# Patient Record
Sex: Female | Born: 1940 | Race: Black or African American | Hispanic: No | Marital: Married | Smoking: Former smoker
Health system: Southern US, Community
[De-identification: ages and names within clinical notes are randomized; demographics above are authoritative.]

## PROBLEM LIST (undated history)

## (undated) DIAGNOSIS — J302 Other seasonal allergic rhinitis: Secondary | ICD-10-CM

## (undated) DIAGNOSIS — H409 Unspecified glaucoma: Secondary | ICD-10-CM

## (undated) DIAGNOSIS — E0500 Thyrotoxicosis with diffuse goiter without thyrotoxic crisis or storm: Secondary | ICD-10-CM

## (undated) DIAGNOSIS — I10 Essential (primary) hypertension: Secondary | ICD-10-CM

## (undated) DIAGNOSIS — M5430 Sciatica, unspecified side: Secondary | ICD-10-CM

## (undated) HISTORY — PX: DILATION AND CURETTAGE OF UTERUS: SHX78

## (undated) HISTORY — DX: Other seasonal allergic rhinitis: J30.2

## (undated) HISTORY — DX: Thyrotoxicosis with diffuse goiter without thyrotoxic crisis or storm: E05.00

## (undated) HISTORY — DX: Unspecified glaucoma: H40.9

## (undated) HISTORY — PX: CYST EXCISION: SHX5701

## (undated) HISTORY — DX: Sciatica, unspecified side: M54.30

## (undated) HISTORY — DX: Essential (primary) hypertension: I10

## (undated) HISTORY — PX: BREAST LUMPECTOMY: SHX2

---

## 1965-08-05 HISTORY — PX: BREAST EXCISIONAL BIOPSY: SUR124

## 2010-05-11 ENCOUNTER — Encounter: Admission: RE | Admit: 2010-05-11 | Discharge: 2010-05-11 | Payer: Self-pay | Admitting: Family Medicine

## 2010-08-14 ENCOUNTER — Other Ambulatory Visit
Admission: RE | Admit: 2010-08-14 | Discharge: 2010-08-14 | Payer: Self-pay | Source: Home / Self Care | Admitting: Obstetrics and Gynecology

## 2010-09-17 ENCOUNTER — Encounter (HOSPITAL_COMMUNITY)
Admission: RE | Admit: 2010-09-17 | Discharge: 2010-09-17 | Disposition: A | Discharge status: Home / Self Care | Payer: Medicare Other | Source: Ambulatory Visit | Attending: Obstetrics and Gynecology | Admitting: Obstetrics and Gynecology

## 2010-09-17 DIAGNOSIS — Z01812 Encounter for preprocedural laboratory examination: Secondary | ICD-10-CM | POA: Insufficient documentation

## 2010-09-17 LAB — URINALYSIS, ROUTINE W REFLEX MICROSCOPIC
Hgb urine dipstick: NEGATIVE
Specific Gravity, Urine: 1.01 (ref 1.005–1.030)
Urine Glucose, Fasting: NEGATIVE mg/dL
pH: 6.5 (ref 5.0–8.0)

## 2010-09-17 LAB — BASIC METABOLIC PANEL
CO2: 32 mEq/L (ref 19–32)
Chloride: 98 mEq/L (ref 96–112)
GFR calc Af Amer: >60 mL/min (ref >60)
Sodium: 137 mEq/L (ref 135–145)

## 2010-09-17 LAB — CBC
HCT: 41 % (ref 36.0–46.0)
Hemoglobin: 13.5 g/dL (ref 12.0–15.0)
RBC: 4.74 MIL/uL (ref 3.87–5.11)
WBC: 7.3 10*3/uL (ref 4.0–10.5)

## 2010-09-19 ENCOUNTER — Other Ambulatory Visit: Payer: Self-pay | Admitting: Obstetrics and Gynecology

## 2010-09-19 ENCOUNTER — Ambulatory Visit (HOSPITAL_COMMUNITY)
Admission: RE | Admit: 2010-09-19 | Discharge: 2010-09-19 | Disposition: A | Discharge status: Home / Self Care | Payer: Medicare Other | Source: Ambulatory Visit | Attending: Obstetrics and Gynecology | Admitting: Obstetrics and Gynecology

## 2010-09-19 DIAGNOSIS — Z01812 Encounter for preprocedural laboratory examination: Secondary | ICD-10-CM | POA: Insufficient documentation

## 2010-09-19 DIAGNOSIS — Z01818 Encounter for other preprocedural examination: Secondary | ICD-10-CM | POA: Insufficient documentation

## 2010-09-19 DIAGNOSIS — I10 Essential (primary) hypertension: Secondary | ICD-10-CM | POA: Insufficient documentation

## 2010-09-19 DIAGNOSIS — N840 Polyp of corpus uteri: Secondary | ICD-10-CM | POA: Insufficient documentation

## 2010-09-19 DIAGNOSIS — N950 Postmenopausal bleeding: Secondary | ICD-10-CM | POA: Insufficient documentation

## 2010-09-19 LAB — POTASSIUM: Potassium: 3 mEq/L — ABNORMAL LOW (ref 3.5–5.1)

## 2010-09-30 NOTE — Op Note (Signed)
  NAMEBLAKLEY, MICHNA NO.:  [id number]  MEDICAL RECORD NO.:  [id number]           PATIENT TYPE:  O  LOCATION:  [facility]                          [facility]  PHYSICIAN:  Gerald Leitz, MD          DATE OF BIRTH:  Jul 03, 1941  DATE OF PROCEDURE: DATE OF DISCHARGE:  09/19/2010                              OPERATIVE REPORT   PREOPERATIVE DIAGNOSES: 1. Postmenopausal bleeding. 2. Uterine fibroids. 3. Suspected endometrial polyps.  POSTOPERATIVE DIAGNOSES: 1. Postmenopausal bleeding. 2. Uterine fibroids. 3. Suspected endometrial polyps.  PROCEDURE:  Hysteroscopy, dilation and curettage, polypectomy with resectoscope.  SURGEON:  Gerald Leitz, MD  ASSISTANT:  None.  ANESTHESIA:  General.  FINDINGS:  Multiple uterine polyps, moderate cystocele.  SPECIMEN:  Endometrial curettings and resecting questionable polyps.  DISPOSITION OF SPECIMEN:  Pathology.  ESTIMATED BLOOD LOSS:  Minimal.  COMPLICATIONS:  None.  DESCRIPTION OF PROCEDURE:  The patient was taken to the operating room where she was placed under general anesthesia.  She was placed in the dorsal lithotomy position.  She was prepped and draped in the usual sterile fashion.  Speculum was placed in the vaginal vault.  The anterior lip of the cervix was grasped with a single-tooth tenaculum. 10 mL of 0.25% Marcaine were injected at the 4 and 8 o'clock positions. The cervix was then sounded to approximately 7 cm.  The cervix was dilated to approximately 6 mm and then diagnostic hysteroscope was inserted with the findings noted above.  The hysteroscope was removed, curette was inserted and a sharp curettage was performed until a gritty texture was noted.  The diagnostic hysteroscope was then reinserted. Polyps were still present and attempt to remove the polyps with polyp forceps was performed, this was unsuccessful.  The resectoscope was then inserted and the polyps were resected using the hysteroscopic  loop. There was no evidence of pressure perforation.  The resectoscope was removed.  The single-tooth tenaculum was removed from the anterior lip of the cervix.  The patient was noted to have some bleeding from the left side at the tenaculum site.  Silver nitrate was applied.  Excellent hemostasis was noted.  Speculum was removed.  The patient was awakened from anesthesia and taken to recovery room awake and in stable condition.  Glycine deficit was 335 mL.     Gerald Leitz, MD     TC/MEDQ  D:  09/19/2010  T:  09/20/2010  Job:  [id number]  Electronically Signed by Gerald Leitz MD on 09/30/2010 11:12:27 AM

## 2011-05-27 ENCOUNTER — Other Ambulatory Visit: Payer: Self-pay | Admitting: Family Medicine

## 2011-05-27 DIAGNOSIS — Z1231 Encounter for screening mammogram for malignant neoplasm of breast: Secondary | ICD-10-CM

## 2011-06-13 ENCOUNTER — Ambulatory Visit
Admission: RE | Admit: 2011-06-13 | Discharge: 2011-06-13 | Disposition: A | Discharge status: Home / Self Care | Payer: Medicare Other | Source: Ambulatory Visit | Attending: Family Medicine | Admitting: Family Medicine

## 2011-06-13 DIAGNOSIS — Z1231 Encounter for screening mammogram for malignant neoplasm of breast: Secondary | ICD-10-CM

## 2011-09-20 DIAGNOSIS — J329 Chronic sinusitis, unspecified: Secondary | ICD-10-CM | POA: Diagnosis not present

## 2011-11-04 DIAGNOSIS — N814 Uterovaginal prolapse, unspecified: Secondary | ICD-10-CM | POA: Diagnosis not present

## 2011-11-04 DIAGNOSIS — R238 Other skin changes: Secondary | ICD-10-CM | POA: Diagnosis not present

## 2011-11-07 DIAGNOSIS — H409 Unspecified glaucoma: Secondary | ICD-10-CM | POA: Diagnosis not present

## 2011-11-07 DIAGNOSIS — H4011X0 Primary open-angle glaucoma, stage unspecified: Secondary | ICD-10-CM | POA: Diagnosis not present

## 2011-11-07 DIAGNOSIS — Z961 Presence of intraocular lens: Secondary | ICD-10-CM | POA: Diagnosis not present

## 2012-01-10 DIAGNOSIS — H269 Unspecified cataract: Secondary | ICD-10-CM | POA: Diagnosis not present

## 2012-01-10 DIAGNOSIS — Z0000 Encounter for general adult medical examination without abnormal findings: Secondary | ICD-10-CM | POA: Diagnosis not present

## 2012-01-10 DIAGNOSIS — I10 Essential (primary) hypertension: Secondary | ICD-10-CM | POA: Diagnosis not present

## 2012-01-10 DIAGNOSIS — H409 Unspecified glaucoma: Secondary | ICD-10-CM | POA: Diagnosis not present

## 2012-01-10 DIAGNOSIS — E0590 Thyrotoxicosis, unspecified without thyrotoxic crisis or storm: Secondary | ICD-10-CM | POA: Diagnosis not present

## 2012-01-10 DIAGNOSIS — Z79899 Other long term (current) drug therapy: Secondary | ICD-10-CM | POA: Diagnosis not present

## 2012-01-10 DIAGNOSIS — E7800 Pure hypercholesterolemia, unspecified: Secondary | ICD-10-CM | POA: Diagnosis not present

## 2012-01-10 DIAGNOSIS — J309 Allergic rhinitis, unspecified: Secondary | ICD-10-CM | POA: Diagnosis not present

## 2012-04-09 DIAGNOSIS — H4011X0 Primary open-angle glaucoma, stage unspecified: Secondary | ICD-10-CM | POA: Diagnosis not present

## 2012-04-09 DIAGNOSIS — Z961 Presence of intraocular lens: Secondary | ICD-10-CM | POA: Diagnosis not present

## 2012-04-09 DIAGNOSIS — H409 Unspecified glaucoma: Secondary | ICD-10-CM | POA: Diagnosis not present

## 2012-07-07 DIAGNOSIS — Z23 Encounter for immunization: Secondary | ICD-10-CM | POA: Diagnosis not present

## 2012-07-15 ENCOUNTER — Other Ambulatory Visit: Payer: Self-pay | Admitting: Family Medicine

## 2012-07-15 DIAGNOSIS — Z1231 Encounter for screening mammogram for malignant neoplasm of breast: Secondary | ICD-10-CM

## 2012-08-24 ENCOUNTER — Ambulatory Visit
Admission: RE | Admit: 2012-08-24 | Discharge: 2012-08-24 | Disposition: A | Discharge status: Home / Self Care | Payer: Medicare Other | Source: Ambulatory Visit | Attending: Family Medicine | Admitting: Family Medicine

## 2012-08-24 DIAGNOSIS — Z1231 Encounter for screening mammogram for malignant neoplasm of breast: Secondary | ICD-10-CM

## 2012-09-08 ENCOUNTER — Other Ambulatory Visit (HOSPITAL_COMMUNITY)
Admission: RE | Admit: 2012-09-08 | Discharge: 2012-09-08 | Disposition: A | Discharge status: Home / Self Care | Payer: Medicare Other | Source: Ambulatory Visit | Attending: Obstetrics and Gynecology | Admitting: Obstetrics and Gynecology

## 2012-09-08 ENCOUNTER — Other Ambulatory Visit: Payer: Self-pay | Admitting: Obstetrics and Gynecology

## 2012-09-08 DIAGNOSIS — Z1151 Encounter for screening for human papillomavirus (HPV): Secondary | ICD-10-CM | POA: Insufficient documentation

## 2012-09-08 DIAGNOSIS — Z124 Encounter for screening for malignant neoplasm of cervix: Secondary | ICD-10-CM | POA: Insufficient documentation

## 2012-09-08 DIAGNOSIS — Z01419 Encounter for gynecological examination (general) (routine) without abnormal findings: Secondary | ICD-10-CM | POA: Diagnosis not present

## 2012-09-08 DIAGNOSIS — N814 Uterovaginal prolapse, unspecified: Secondary | ICD-10-CM | POA: Diagnosis not present

## 2012-11-05 DIAGNOSIS — I10 Essential (primary) hypertension: Secondary | ICD-10-CM | POA: Diagnosis not present

## 2012-11-05 DIAGNOSIS — J309 Allergic rhinitis, unspecified: Secondary | ICD-10-CM | POA: Diagnosis not present

## 2012-11-10 DIAGNOSIS — H409 Unspecified glaucoma: Secondary | ICD-10-CM | POA: Diagnosis not present

## 2012-11-10 DIAGNOSIS — H4011X0 Primary open-angle glaucoma, stage unspecified: Secondary | ICD-10-CM | POA: Diagnosis not present

## 2013-01-22 DIAGNOSIS — I10 Essential (primary) hypertension: Secondary | ICD-10-CM | POA: Diagnosis not present

## 2013-01-22 DIAGNOSIS — Z79899 Other long term (current) drug therapy: Secondary | ICD-10-CM | POA: Diagnosis not present

## 2013-01-22 DIAGNOSIS — J309 Allergic rhinitis, unspecified: Secondary | ICD-10-CM | POA: Diagnosis not present

## 2013-01-22 DIAGNOSIS — N951 Menopausal and female climacteric states: Secondary | ICD-10-CM | POA: Diagnosis not present

## 2013-01-22 DIAGNOSIS — Z6835 Body mass index (BMI) 35.0-35.9, adult: Secondary | ICD-10-CM | POA: Diagnosis not present

## 2013-01-22 DIAGNOSIS — E0590 Thyrotoxicosis, unspecified without thyrotoxic crisis or storm: Secondary | ICD-10-CM | POA: Diagnosis not present

## 2013-01-22 DIAGNOSIS — Z0000 Encounter for general adult medical examination without abnormal findings: Secondary | ICD-10-CM | POA: Diagnosis not present

## 2013-03-03 DIAGNOSIS — Z780 Asymptomatic menopausal state: Secondary | ICD-10-CM | POA: Diagnosis not present

## 2013-05-14 DIAGNOSIS — H4011X0 Primary open-angle glaucoma, stage unspecified: Secondary | ICD-10-CM | POA: Diagnosis not present

## 2013-05-14 DIAGNOSIS — Z961 Presence of intraocular lens: Secondary | ICD-10-CM | POA: Diagnosis not present

## 2013-05-14 DIAGNOSIS — H409 Unspecified glaucoma: Secondary | ICD-10-CM | POA: Diagnosis not present

## 2013-10-13 ENCOUNTER — Other Ambulatory Visit: Payer: Self-pay

## 2013-10-13 DIAGNOSIS — Z1231 Encounter for screening mammogram for malignant neoplasm of breast: Secondary | ICD-10-CM

## 2013-10-27 ENCOUNTER — Ambulatory Visit: Payer: Medicare Other

## 2013-11-16 DIAGNOSIS — N814 Uterovaginal prolapse, unspecified: Secondary | ICD-10-CM | POA: Diagnosis not present

## 2013-11-16 DIAGNOSIS — K5900 Constipation, unspecified: Secondary | ICD-10-CM | POA: Diagnosis not present

## 2013-11-16 DIAGNOSIS — N644 Mastodynia: Secondary | ICD-10-CM | POA: Diagnosis not present

## 2013-11-19 ENCOUNTER — Ambulatory Visit: Payer: Medicare Other

## 2013-11-19 ENCOUNTER — Ambulatory Visit
Admission: RE | Admit: 2013-11-19 | Discharge: 2013-11-19 | Disposition: A | Discharge status: Home / Self Care | Payer: Medicare Other | Source: Ambulatory Visit

## 2013-11-19 DIAGNOSIS — Z1231 Encounter for screening mammogram for malignant neoplasm of breast: Secondary | ICD-10-CM | POA: Diagnosis not present

## 2013-12-06 DIAGNOSIS — N814 Uterovaginal prolapse, unspecified: Secondary | ICD-10-CM | POA: Diagnosis not present

## 2013-12-09 DIAGNOSIS — Z1211 Encounter for screening for malignant neoplasm of colon: Secondary | ICD-10-CM | POA: Diagnosis not present

## 2013-12-09 DIAGNOSIS — Z86010 Personal history of colonic polyps: Secondary | ICD-10-CM | POA: Diagnosis not present

## 2013-12-09 DIAGNOSIS — K219 Gastro-esophageal reflux disease without esophagitis: Secondary | ICD-10-CM | POA: Diagnosis not present

## 2013-12-20 DIAGNOSIS — N814 Uterovaginal prolapse, unspecified: Secondary | ICD-10-CM | POA: Diagnosis not present

## 2013-12-29 DIAGNOSIS — H4011X0 Primary open-angle glaucoma, stage unspecified: Secondary | ICD-10-CM | POA: Diagnosis not present

## 2013-12-29 DIAGNOSIS — Z961 Presence of intraocular lens: Secondary | ICD-10-CM | POA: Diagnosis not present

## 2013-12-29 DIAGNOSIS — H01009 Unspecified blepharitis unspecified eye, unspecified eyelid: Secondary | ICD-10-CM | POA: Diagnosis not present

## 2013-12-29 DIAGNOSIS — H409 Unspecified glaucoma: Secondary | ICD-10-CM | POA: Diagnosis not present

## 2014-01-25 DIAGNOSIS — Z79899 Other long term (current) drug therapy: Secondary | ICD-10-CM | POA: Diagnosis not present

## 2014-01-25 DIAGNOSIS — I10 Essential (primary) hypertension: Secondary | ICD-10-CM | POA: Diagnosis not present

## 2014-01-25 DIAGNOSIS — H409 Unspecified glaucoma: Secondary | ICD-10-CM | POA: Diagnosis not present

## 2014-01-25 DIAGNOSIS — H269 Unspecified cataract: Secondary | ICD-10-CM | POA: Diagnosis not present

## 2014-01-25 DIAGNOSIS — Z800 Family history of malignant neoplasm of digestive organs: Secondary | ICD-10-CM | POA: Diagnosis not present

## 2014-01-25 DIAGNOSIS — Z6835 Body mass index (BMI) 35.0-35.9, adult: Secondary | ICD-10-CM | POA: Diagnosis not present

## 2014-01-25 DIAGNOSIS — Z0000 Encounter for general adult medical examination without abnormal findings: Secondary | ICD-10-CM | POA: Diagnosis not present

## 2014-01-25 DIAGNOSIS — E0590 Thyrotoxicosis, unspecified without thyrotoxic crisis or storm: Secondary | ICD-10-CM | POA: Diagnosis not present

## 2014-02-02 DIAGNOSIS — J069 Acute upper respiratory infection, unspecified: Secondary | ICD-10-CM | POA: Diagnosis not present

## 2014-02-23 DIAGNOSIS — D126 Benign neoplasm of colon, unspecified: Secondary | ICD-10-CM | POA: Diagnosis not present

## 2014-02-23 DIAGNOSIS — Z1211 Encounter for screening for malignant neoplasm of colon: Secondary | ICD-10-CM | POA: Diagnosis not present

## 2014-02-23 DIAGNOSIS — K5730 Diverticulosis of large intestine without perforation or abscess without bleeding: Secondary | ICD-10-CM | POA: Diagnosis not present

## 2014-02-23 DIAGNOSIS — Z86010 Personal history of colonic polyps: Secondary | ICD-10-CM | POA: Diagnosis not present

## 2014-06-01 DIAGNOSIS — H4011X2 Primary open-angle glaucoma, moderate stage: Secondary | ICD-10-CM | POA: Diagnosis not present

## 2014-06-14 DIAGNOSIS — Z23 Encounter for immunization: Secondary | ICD-10-CM | POA: Diagnosis not present

## 2014-12-02 DIAGNOSIS — H4011X2 Primary open-angle glaucoma, moderate stage: Secondary | ICD-10-CM | POA: Diagnosis not present

## 2014-12-14 ENCOUNTER — Other Ambulatory Visit: Payer: Self-pay

## 2014-12-14 DIAGNOSIS — Z1231 Encounter for screening mammogram for malignant neoplasm of breast: Secondary | ICD-10-CM

## 2014-12-21 DIAGNOSIS — Z01411 Encounter for gynecological examination (general) (routine) with abnormal findings: Secondary | ICD-10-CM | POA: Diagnosis not present

## 2014-12-21 DIAGNOSIS — B379 Candidiasis, unspecified: Secondary | ICD-10-CM | POA: Diagnosis not present

## 2014-12-21 DIAGNOSIS — N814 Uterovaginal prolapse, unspecified: Secondary | ICD-10-CM | POA: Diagnosis not present

## 2015-01-06 ENCOUNTER — Ambulatory Visit
Admission: RE | Admit: 2015-01-06 | Discharge: 2015-01-06 | Disposition: A | Discharge status: Home / Self Care | Payer: Medicare Other | Source: Ambulatory Visit

## 2015-01-06 DIAGNOSIS — Z1231 Encounter for screening mammogram for malignant neoplasm of breast: Secondary | ICD-10-CM

## 2015-02-14 DIAGNOSIS — I10 Essential (primary) hypertension: Secondary | ICD-10-CM | POA: Diagnosis not present

## 2015-02-14 DIAGNOSIS — Z0000 Encounter for general adult medical examination without abnormal findings: Secondary | ICD-10-CM | POA: Diagnosis not present

## 2015-02-14 DIAGNOSIS — E559 Vitamin D deficiency, unspecified: Secondary | ICD-10-CM | POA: Diagnosis not present

## 2015-02-14 DIAGNOSIS — Z79899 Other long term (current) drug therapy: Secondary | ICD-10-CM | POA: Diagnosis not present

## 2015-02-14 DIAGNOSIS — Z23 Encounter for immunization: Secondary | ICD-10-CM | POA: Diagnosis not present

## 2015-02-14 DIAGNOSIS — R739 Hyperglycemia, unspecified: Secondary | ICD-10-CM | POA: Diagnosis not present

## 2015-02-14 DIAGNOSIS — D692 Other nonthrombocytopenic purpura: Secondary | ICD-10-CM | POA: Diagnosis not present

## 2015-02-14 DIAGNOSIS — H6123 Impacted cerumen, bilateral: Secondary | ICD-10-CM | POA: Diagnosis not present

## 2015-02-14 DIAGNOSIS — E0590 Thyrotoxicosis, unspecified without thyrotoxic crisis or storm: Secondary | ICD-10-CM | POA: Diagnosis not present

## 2015-06-15 DIAGNOSIS — M5432 Sciatica, left side: Secondary | ICD-10-CM | POA: Diagnosis not present

## 2015-06-15 DIAGNOSIS — Z23 Encounter for immunization: Secondary | ICD-10-CM | POA: Diagnosis not present

## 2015-06-15 DIAGNOSIS — R251 Tremor, unspecified: Secondary | ICD-10-CM | POA: Diagnosis not present

## 2015-06-15 DIAGNOSIS — M722 Plantar fascial fibromatosis: Secondary | ICD-10-CM | POA: Diagnosis not present

## 2015-07-04 ENCOUNTER — Other Ambulatory Visit: Payer: Medicare Other

## 2015-07-04 ENCOUNTER — Other Ambulatory Visit: Payer: Self-pay | Admitting: Family Medicine

## 2015-07-04 DIAGNOSIS — M79604 Pain in right leg: Secondary | ICD-10-CM

## 2015-07-05 ENCOUNTER — Ambulatory Visit
Admission: RE | Admit: 2015-07-05 | Discharge: 2015-07-05 | Disposition: A | Discharge status: Home / Self Care | Payer: Medicare Other | Source: Ambulatory Visit | Attending: Family Medicine | Admitting: Family Medicine

## 2015-07-05 DIAGNOSIS — M79604 Pain in right leg: Secondary | ICD-10-CM

## 2015-07-05 DIAGNOSIS — M79661 Pain in right lower leg: Secondary | ICD-10-CM | POA: Diagnosis not present

## 2015-07-10 ENCOUNTER — Encounter: Payer: Self-pay | Admitting: Neurology

## 2015-07-10 ENCOUNTER — Ambulatory Visit (INDEPENDENT_AMBULATORY_CARE_PROVIDER_SITE_OTHER): Payer: Medicare Other | Admitting: Neurology

## 2015-07-10 VITALS — BP 120/78 | HR 65 | Ht 65.0 in | Wt 215.0 lb

## 2015-07-10 DIAGNOSIS — M79604 Pain in right leg: Secondary | ICD-10-CM | POA: Diagnosis not present

## 2015-07-10 DIAGNOSIS — R251 Tremor, unspecified: Secondary | ICD-10-CM | POA: Diagnosis not present

## 2015-07-10 NOTE — Progress Notes (Signed)
Caitlin Woodward was seen today in the movement disorders clinic for neurologic consultation at the request of Lilian Coma, MD.  The consultation is for the evaluation of tremor and to r/o PD.  Pt reports that she has had tremor for many years (1986) and initially thought it was due to Graves disease but after treatment it did not go away.  So, she was referred to [facility] for it in 2008 and was told not to worry about it.  She moved here in 2010.  She states that she went to her PCP for her sciatic pain and she noted that the tremor was increased.  The patient thought it was b/c her sciatic pain was increasing.  She was given etodolac and it helped but once she d/c it it came back.  She has started prednisone and will finish in 2 days.  That has not really changed tremor.  Stress is the only thing that changes tremor.  She drinks 2-3 cups of coffee per day and she thinks that may pick up tremor.  Alcohol does not change tremor.  No trouble eating soup.  Both hands shake equally.  Hands only shake with use.  Fam hx of tremor in father and fathers sister.  Specific Symptoms:  Tremor: Yes.   Voice: no change Sleep: sleeps well  Vivid Dreams:  No.  Acting out dreams:  No. Wet Pillows: No. Postural symptoms:  No.  Falls?  No. Bradykinesia symptoms: no bradykinesia noted Loss of smell:  No. Loss of taste:  No. Urinary Incontinence:  No. Difficulty Swallowing:  No. Handwriting, micrographia: No. Trouble with ADL's:  No.  Trouble buttoning clothing: No. Depression:  No. Memory changes:  No. Hallucinations:  No.  visual distortions: No. N/V:  No. Lightheaded:  No.  Syncope: No. Diplopia:  No. Dyskinesia:  No.  Neuroimaging has not previously been performed.   As above, she is complaining about some "sciatic pain" in the right leg.  It was involving the entire right leg and buttocks and she had trouble isolating it further but after taking the etodolac and now the prednisone seems  to only involve the right knee and right ankle and is much more severe when she drives.  She also notices rather significant groin pain now.  PREVIOUS MEDICATIONS: none to date  ALLERGIES:   Allergies  Allergen Reactions  . Codeine Nausea And Vomiting    CURRENT MEDICATIONS:  Outpatient Encounter Prescriptions as of 07/10/2015  Medication Sig  . aspirin 81 MG tablet Take 81 mg by mouth daily.  . cetirizine (ZYRTEC) 10 MG tablet Take 10 mg by mouth daily as needed for allergies.  . chlorthalidone (HYGROTON) 25 MG tablet Take 25 mg by mouth daily.   . dorzolamide (TRUSOPT) 2 % ophthalmic solution Place 1 drop into both eyes 2 (two) times daily.  Marland Kitchen latanoprost (XALATAN) 0.005 % ophthalmic solution Place 1 drop into both eyes at bedtime.   . Multiple Vitamin (MULTIVITAMIN) tablet Take 1 tablet by mouth daily.  . predniSONE (STERAPRED UNI-PAK 21 TAB) 10 MG (21) TBPK tablet USE AS DIRECTED. TAPER AS FOLLOWED: 6-5-4-3-2-1   No facility-administered encounter medications on file as of 07/10/2015.    PAST MEDICAL HISTORY:   Past Medical History  Diagnosis Date  . Glaucoma   . Hypertension   . Seasonal allergies   . Sciatica   . Graves disease     PAST SURGICAL HISTORY:   Past Surgical History  Procedure Laterality Date  .  Dilation and curettage of uterus      x 3  . Breast lumpectomy      non cancerous  . Cyst excision      foot    SOCIAL HISTORY:   Social History   Social History  . Marital Status: Married    Spouse Name: N/A  . Number of Children: N/A  . Years of Education: N/A   Occupational History  . Not on file.   Social History Main Topics  . Smoking status: Former Smoker -- 2 years    Quit date: 07/09/1960  . Smokeless tobacco: Not on file  . Alcohol Use: 0.0 oz/week    0 Standard drinks or equivalent per week     Comment: wine - 2-3 drinks a week  . Drug Use: No  . Sexual Activity: Not on file   Other Topics Concern  . Not on file   Social History  Narrative  . No narrative on file    FAMILY HISTORY:   Family Status  Relation Status Death Age  . Mother Alive     97 - Hypertension, glaucoma  . Father Deceased     alzheimers  . Brother Deceased     colon cancer  . Son Alive     healthy  . Son Alive     healthy  . Daughter Alive     eczema    ROS:  A complete 10 system review of systems was obtained and was unremarkable apart from what is mentioned above.  PHYSICAL EXAMINATION:    VITALS:   Filed Vitals:   07/10/15 1229  BP: 120/78  Pulse: 65  Height: 5\' 5"  (1.651 m)  Weight: 215 lb (97.523 kg)    GEN:  The patient appears stated age and is in NAD. HEENT:  Normocephalic, atraumatic.  The mucous membranes are moist. The superficial temporal arteries are without ropiness or tenderness. CV:  RRR Lungs:  CTAB Neck/HEME:  There are no carotid bruits bilaterally.  Neurological examination:  Orientation: The patient is alert and oriented x3. Fund of knowledge is appropriate.  Recent and remote memory are intact.  Attention and concentration are normal.    Able to name objects and repeat phrases. Cranial nerves: There is good facial symmetry. Pupils are equal round and reactive to light bilaterally. Fundoscopic exam reveals clear margins bilaterally. Extraocular muscles are intact. The visual fields are full to confrontational testing. The speech is fluent and clear. Soft palate rises symmetrically and there is no tongue deviation. Hearing is intact to conversational tone. Sensation: Sensation is intact to light and pinprick throughout (facial, trunk, extremities). Vibration is intact at the bilateral big toe, although it is decreased. There is no extinction with double simultaneous stimulation. There is no sensory dermatomal level identified. Motor: Strength is 5/5 in the bilateral upper and lower extremities.   Shoulder shrug is equal and symmetric.  There is no pronator drift. Deep tendon reflexes: Deep tendon reflexes are  0-1/4 at the bilateral biceps, triceps, brachioradialis, patella and achilles. Plantar responses are downgoing bilaterally.    Movement examination: Tone: There is normal tone in the bilateral upper extremities.  The tone in the lower extremities is normal.  Abnormal movements: There is no tremor at rest.  There was very rare tremor of the outstretched hands.  There was slight difficulty with Archimedes spirals.  She spilled some water when asked to pour a full glass of water from one glass to another, but it is more because  of carelessness than it is because of tremor. Coordination:  There is no decremation with RAM's, with any form of RAMS, including alternating supination and pronation of the forearm, hand opening and closing, finger taps, heel taps and toe taps. Gait and Station: The patient has no difficulty arising out of a deep-seated chair without the use of the hands. The patient's stride length is normal with normal arm swing.    ASSESSMENT/PLAN:  1.  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  She certainly did not have any evidence of parkinsonism today and does not meet criteria for idiopathic Parkinson's disease.  Reassurance was provided.  I did tell her that should any further neurologic symptoms develop she should not hesitate to call me and I would be happy to see her back.  Patient education was provided. 2.  Right leg pain  -This is actually markedly improved after a course of etodolac and now prednisone.  She only has 2 days of prednisone left, but states that it is still very difficult for her to push on the gas and brake and drive.  I encouraged her to follow-up with her primary care physician if it does not go away after the course of prednisone as I am not completely convinced that this is all radicular in nature.  She is no longer having any back pain at all and states that it is primarily isolated to the knee and  below, but is also having some groin pain, which makes me think that this may be a hip issue.  Regardless, she may need some imaging.  She assured me that she would follow-up with her primary care physician in regards to the symptoms. 3.  I will follow up with her on an as-needed basis.  Much greater than 50% of this visit was spent in counseling with the patient.  Total face to face time:  45 min

## 2015-08-09 DIAGNOSIS — H401112 Primary open-angle glaucoma, right eye, moderate stage: Secondary | ICD-10-CM | POA: Diagnosis not present

## 2015-08-09 DIAGNOSIS — Z0100 Encounter for examination of eyes and vision without abnormal findings: Secondary | ICD-10-CM | POA: Diagnosis not present

## 2015-08-09 DIAGNOSIS — H401122 Primary open-angle glaucoma, left eye, moderate stage: Secondary | ICD-10-CM | POA: Diagnosis not present

## 2015-08-09 DIAGNOSIS — Z961 Presence of intraocular lens: Secondary | ICD-10-CM | POA: Diagnosis not present

## 2015-09-01 DIAGNOSIS — M5431 Sciatica, right side: Secondary | ICD-10-CM | POA: Diagnosis not present

## 2015-09-01 DIAGNOSIS — M25551 Pain in right hip: Secondary | ICD-10-CM | POA: Diagnosis not present

## 2015-09-01 DIAGNOSIS — Z6835 Body mass index (BMI) 35.0-35.9, adult: Secondary | ICD-10-CM | POA: Diagnosis not present

## 2015-09-12 DIAGNOSIS — M5416 Radiculopathy, lumbar region: Secondary | ICD-10-CM | POA: Diagnosis not present

## 2015-09-12 DIAGNOSIS — M25552 Pain in left hip: Secondary | ICD-10-CM | POA: Diagnosis not present

## 2015-09-15 DIAGNOSIS — M25551 Pain in right hip: Secondary | ICD-10-CM | POA: Diagnosis not present

## 2015-09-15 DIAGNOSIS — M5416 Radiculopathy, lumbar region: Secondary | ICD-10-CM | POA: Diagnosis not present

## 2015-09-19 DIAGNOSIS — M5416 Radiculopathy, lumbar region: Secondary | ICD-10-CM | POA: Diagnosis not present

## 2015-09-19 DIAGNOSIS — M25551 Pain in right hip: Secondary | ICD-10-CM | POA: Diagnosis not present

## 2015-09-22 DIAGNOSIS — M5416 Radiculopathy, lumbar region: Secondary | ICD-10-CM | POA: Diagnosis not present

## 2015-09-22 DIAGNOSIS — M25551 Pain in right hip: Secondary | ICD-10-CM | POA: Diagnosis not present

## 2015-09-26 DIAGNOSIS — M25551 Pain in right hip: Secondary | ICD-10-CM | POA: Diagnosis not present

## 2015-09-26 DIAGNOSIS — M5416 Radiculopathy, lumbar region: Secondary | ICD-10-CM | POA: Diagnosis not present

## 2015-09-29 DIAGNOSIS — M25551 Pain in right hip: Secondary | ICD-10-CM | POA: Diagnosis not present

## 2015-09-29 DIAGNOSIS — M5416 Radiculopathy, lumbar region: Secondary | ICD-10-CM | POA: Diagnosis not present

## 2015-10-03 DIAGNOSIS — M25551 Pain in right hip: Secondary | ICD-10-CM | POA: Diagnosis not present

## 2015-10-03 DIAGNOSIS — M5416 Radiculopathy, lumbar region: Secondary | ICD-10-CM | POA: Diagnosis not present

## 2015-10-06 DIAGNOSIS — M25551 Pain in right hip: Secondary | ICD-10-CM | POA: Diagnosis not present

## 2015-10-06 DIAGNOSIS — M5416 Radiculopathy, lumbar region: Secondary | ICD-10-CM | POA: Diagnosis not present

## 2015-10-13 DIAGNOSIS — M5416 Radiculopathy, lumbar region: Secondary | ICD-10-CM | POA: Diagnosis not present

## 2015-10-13 DIAGNOSIS — M25551 Pain in right hip: Secondary | ICD-10-CM | POA: Diagnosis not present

## 2015-10-17 DIAGNOSIS — M5416 Radiculopathy, lumbar region: Secondary | ICD-10-CM | POA: Diagnosis not present

## 2015-10-17 DIAGNOSIS — M25551 Pain in right hip: Secondary | ICD-10-CM | POA: Diagnosis not present

## 2015-10-20 DIAGNOSIS — M5416 Radiculopathy, lumbar region: Secondary | ICD-10-CM | POA: Diagnosis not present

## 2015-10-20 DIAGNOSIS — M25551 Pain in right hip: Secondary | ICD-10-CM | POA: Diagnosis not present

## 2015-10-25 DIAGNOSIS — R300 Dysuria: Secondary | ICD-10-CM | POA: Diagnosis not present

## 2015-11-07 ENCOUNTER — Other Ambulatory Visit: Payer: Self-pay | Admitting: Physical Medicine and Rehabilitation

## 2015-11-07 DIAGNOSIS — M545 Low back pain: Secondary | ICD-10-CM

## 2015-11-07 DIAGNOSIS — M5416 Radiculopathy, lumbar region: Secondary | ICD-10-CM | POA: Diagnosis not present

## 2015-11-07 DIAGNOSIS — M47817 Spondylosis without myelopathy or radiculopathy, lumbosacral region: Secondary | ICD-10-CM | POA: Diagnosis not present

## 2015-11-10 DIAGNOSIS — M7742 Metatarsalgia, left foot: Secondary | ICD-10-CM | POA: Diagnosis not present

## 2015-11-10 DIAGNOSIS — M2041 Other hammer toe(s) (acquired), right foot: Secondary | ICD-10-CM | POA: Diagnosis not present

## 2015-11-10 DIAGNOSIS — M79674 Pain in right toe(s): Secondary | ICD-10-CM | POA: Diagnosis not present

## 2015-11-10 DIAGNOSIS — M79675 Pain in left toe(s): Secondary | ICD-10-CM | POA: Diagnosis not present

## 2015-11-10 DIAGNOSIS — M7741 Metatarsalgia, right foot: Secondary | ICD-10-CM | POA: Diagnosis not present

## 2015-11-10 DIAGNOSIS — L602 Onychogryphosis: Secondary | ICD-10-CM | POA: Diagnosis not present

## 2015-11-10 DIAGNOSIS — M2042 Other hammer toe(s) (acquired), left foot: Secondary | ICD-10-CM | POA: Diagnosis not present

## 2015-11-14 ENCOUNTER — Ambulatory Visit
Admission: RE | Admit: 2015-11-14 | Discharge: 2015-11-14 | Disposition: A | Discharge status: Home / Self Care | Payer: Medicare Other | Source: Ambulatory Visit | Attending: Physical Medicine and Rehabilitation | Admitting: Physical Medicine and Rehabilitation

## 2015-11-14 DIAGNOSIS — M545 Low back pain: Secondary | ICD-10-CM

## 2015-11-14 DIAGNOSIS — M5126 Other intervertebral disc displacement, lumbar region: Secondary | ICD-10-CM | POA: Diagnosis not present

## 2015-11-21 DIAGNOSIS — M25551 Pain in right hip: Secondary | ICD-10-CM | POA: Diagnosis not present

## 2015-11-28 ENCOUNTER — Other Ambulatory Visit: Payer: Self-pay | Admitting: Family Medicine

## 2015-11-28 ENCOUNTER — Ambulatory Visit
Admission: RE | Admit: 2015-11-28 | Discharge: 2015-11-28 | Disposition: A | Discharge status: Home / Self Care | Payer: Medicare Other | Source: Ambulatory Visit | Attending: Family Medicine | Admitting: Family Medicine

## 2015-11-28 DIAGNOSIS — N281 Cyst of kidney, acquired: Secondary | ICD-10-CM | POA: Diagnosis not present

## 2015-12-04 DIAGNOSIS — M25551 Pain in right hip: Secondary | ICD-10-CM | POA: Diagnosis not present

## 2015-12-08 DIAGNOSIS — M7742 Metatarsalgia, left foot: Secondary | ICD-10-CM | POA: Diagnosis not present

## 2015-12-08 DIAGNOSIS — M7741 Metatarsalgia, right foot: Secondary | ICD-10-CM | POA: Diagnosis not present

## 2015-12-08 DIAGNOSIS — M79674 Pain in right toe(s): Secondary | ICD-10-CM | POA: Diagnosis not present

## 2015-12-08 DIAGNOSIS — L602 Onychogryphosis: Secondary | ICD-10-CM | POA: Diagnosis not present

## 2016-01-02 DIAGNOSIS — M25551 Pain in right hip: Secondary | ICD-10-CM | POA: Diagnosis not present

## 2016-01-24 DIAGNOSIS — N8111 Cystocele, midline: Secondary | ICD-10-CM | POA: Diagnosis not present

## 2016-01-24 DIAGNOSIS — N814 Uterovaginal prolapse, unspecified: Secondary | ICD-10-CM | POA: Diagnosis not present

## 2016-01-26 DIAGNOSIS — L602 Onychogryphosis: Secondary | ICD-10-CM | POA: Diagnosis not present

## 2016-01-26 DIAGNOSIS — M79674 Pain in right toe(s): Secondary | ICD-10-CM | POA: Diagnosis not present

## 2016-01-26 DIAGNOSIS — M7741 Metatarsalgia, right foot: Secondary | ICD-10-CM | POA: Diagnosis not present

## 2016-01-26 DIAGNOSIS — M79675 Pain in left toe(s): Secondary | ICD-10-CM | POA: Diagnosis not present

## 2016-01-26 DIAGNOSIS — M2041 Other hammer toe(s) (acquired), right foot: Secondary | ICD-10-CM | POA: Diagnosis not present

## 2016-01-26 DIAGNOSIS — M7742 Metatarsalgia, left foot: Secondary | ICD-10-CM | POA: Diagnosis not present

## 2016-01-26 DIAGNOSIS — M2042 Other hammer toe(s) (acquired), left foot: Secondary | ICD-10-CM | POA: Diagnosis not present

## 2016-02-14 DIAGNOSIS — H401122 Primary open-angle glaucoma, left eye, moderate stage: Secondary | ICD-10-CM | POA: Diagnosis not present

## 2016-02-14 DIAGNOSIS — H401111 Primary open-angle glaucoma, right eye, mild stage: Secondary | ICD-10-CM | POA: Diagnosis not present

## 2016-03-06 ENCOUNTER — Other Ambulatory Visit: Payer: Self-pay | Admitting: Family Medicine

## 2016-03-06 DIAGNOSIS — Z1231 Encounter for screening mammogram for malignant neoplasm of breast: Secondary | ICD-10-CM

## 2016-03-11 DIAGNOSIS — Z79899 Other long term (current) drug therapy: Secondary | ICD-10-CM | POA: Diagnosis not present

## 2016-03-11 DIAGNOSIS — E559 Vitamin D deficiency, unspecified: Secondary | ICD-10-CM | POA: Diagnosis not present

## 2016-03-11 DIAGNOSIS — N281 Cyst of kidney, acquired: Secondary | ICD-10-CM | POA: Diagnosis not present

## 2016-03-11 DIAGNOSIS — Z6835 Body mass index (BMI) 35.0-35.9, adult: Secondary | ICD-10-CM | POA: Diagnosis not present

## 2016-03-11 DIAGNOSIS — R7309 Other abnormal glucose: Secondary | ICD-10-CM | POA: Diagnosis not present

## 2016-03-11 DIAGNOSIS — Z23 Encounter for immunization: Secondary | ICD-10-CM | POA: Diagnosis not present

## 2016-03-11 DIAGNOSIS — Z0000 Encounter for general adult medical examination without abnormal findings: Secondary | ICD-10-CM | POA: Diagnosis not present

## 2016-03-11 DIAGNOSIS — I10 Essential (primary) hypertension: Secondary | ICD-10-CM | POA: Diagnosis not present

## 2016-03-15 ENCOUNTER — Ambulatory Visit
Admission: RE | Admit: 2016-03-15 | Discharge: 2016-03-15 | Disposition: A | Discharge status: Home / Self Care | Payer: Medicare Other | Source: Ambulatory Visit | Attending: Family Medicine | Admitting: Family Medicine

## 2016-03-15 DIAGNOSIS — Z1231 Encounter for screening mammogram for malignant neoplasm of breast: Secondary | ICD-10-CM | POA: Diagnosis not present

## 2016-03-20 DIAGNOSIS — N814 Uterovaginal prolapse, unspecified: Secondary | ICD-10-CM | POA: Diagnosis not present

## 2016-03-20 DIAGNOSIS — N8111 Cystocele, midline: Secondary | ICD-10-CM | POA: Diagnosis not present

## 2016-05-28 DIAGNOSIS — Z23 Encounter for immunization: Secondary | ICD-10-CM | POA: Diagnosis not present

## 2016-05-30 ENCOUNTER — Other Ambulatory Visit: Payer: Self-pay | Admitting: Family Medicine

## 2016-05-30 DIAGNOSIS — N281 Cyst of kidney, acquired: Secondary | ICD-10-CM

## 2016-07-08 ENCOUNTER — Ambulatory Visit
Admission: RE | Admit: 2016-07-08 | Discharge: 2016-07-08 | Disposition: A | Discharge status: Home / Self Care | Payer: Medicare Other | Source: Ambulatory Visit | Attending: Family Medicine | Admitting: Family Medicine

## 2016-07-08 DIAGNOSIS — N281 Cyst of kidney, acquired: Secondary | ICD-10-CM | POA: Diagnosis not present

## 2016-08-12 DIAGNOSIS — H52203 Unspecified astigmatism, bilateral: Secondary | ICD-10-CM | POA: Diagnosis not present

## 2016-08-12 DIAGNOSIS — H401122 Primary open-angle glaucoma, left eye, moderate stage: Secondary | ICD-10-CM | POA: Diagnosis not present

## 2016-08-12 DIAGNOSIS — H401111 Primary open-angle glaucoma, right eye, mild stage: Secondary | ICD-10-CM | POA: Diagnosis not present

## 2016-08-12 DIAGNOSIS — Z961 Presence of intraocular lens: Secondary | ICD-10-CM | POA: Diagnosis not present

## 2016-08-26 DIAGNOSIS — R319 Hematuria, unspecified: Secondary | ICD-10-CM | POA: Diagnosis not present

## 2016-08-30 DIAGNOSIS — N281 Cyst of kidney, acquired: Secondary | ICD-10-CM | POA: Diagnosis not present

## 2016-09-06 DIAGNOSIS — D259 Leiomyoma of uterus, unspecified: Secondary | ICD-10-CM | POA: Diagnosis not present

## 2016-09-06 DIAGNOSIS — R918 Other nonspecific abnormal finding of lung field: Secondary | ICD-10-CM | POA: Diagnosis not present

## 2016-09-06 DIAGNOSIS — N281 Cyst of kidney, acquired: Secondary | ICD-10-CM | POA: Diagnosis not present

## 2016-09-25 DIAGNOSIS — N814 Uterovaginal prolapse, unspecified: Secondary | ICD-10-CM | POA: Diagnosis not present

## 2016-09-25 DIAGNOSIS — N8111 Cystocele, midline: Secondary | ICD-10-CM | POA: Diagnosis not present

## 2016-12-16 DIAGNOSIS — H6122 Impacted cerumen, left ear: Secondary | ICD-10-CM | POA: Diagnosis not present

## 2016-12-16 DIAGNOSIS — J019 Acute sinusitis, unspecified: Secondary | ICD-10-CM | POA: Diagnosis not present

## 2017-01-22 DIAGNOSIS — Z01411 Encounter for gynecological examination (general) (routine) with abnormal findings: Secondary | ICD-10-CM | POA: Diagnosis not present

## 2017-01-22 DIAGNOSIS — N814 Uterovaginal prolapse, unspecified: Secondary | ICD-10-CM | POA: Diagnosis not present

## 2017-02-10 DIAGNOSIS — H401111 Primary open-angle glaucoma, right eye, mild stage: Secondary | ICD-10-CM | POA: Diagnosis not present

## 2017-02-10 DIAGNOSIS — H401122 Primary open-angle glaucoma, left eye, moderate stage: Secondary | ICD-10-CM | POA: Diagnosis not present

## 2017-02-11 DIAGNOSIS — R319 Hematuria, unspecified: Secondary | ICD-10-CM | POA: Diagnosis not present

## 2017-02-11 DIAGNOSIS — N281 Cyst of kidney, acquired: Secondary | ICD-10-CM | POA: Diagnosis not present

## 2017-02-14 DIAGNOSIS — R918 Other nonspecific abnormal finding of lung field: Secondary | ICD-10-CM | POA: Diagnosis not present

## 2017-02-14 DIAGNOSIS — D259 Leiomyoma of uterus, unspecified: Secondary | ICD-10-CM | POA: Diagnosis not present

## 2017-02-14 DIAGNOSIS — N281 Cyst of kidney, acquired: Secondary | ICD-10-CM | POA: Diagnosis not present

## 2017-03-17 ENCOUNTER — Other Ambulatory Visit: Payer: Self-pay | Admitting: Family Medicine

## 2017-03-17 DIAGNOSIS — Z1231 Encounter for screening mammogram for malignant neoplasm of breast: Secondary | ICD-10-CM

## 2017-03-24 DIAGNOSIS — N8111 Cystocele, midline: Secondary | ICD-10-CM | POA: Diagnosis not present

## 2017-03-24 DIAGNOSIS — Z0000 Encounter for general adult medical examination without abnormal findings: Secondary | ICD-10-CM | POA: Diagnosis not present

## 2017-03-24 DIAGNOSIS — I10 Essential (primary) hypertension: Secondary | ICD-10-CM | POA: Diagnosis not present

## 2017-03-24 DIAGNOSIS — Z6835 Body mass index (BMI) 35.0-35.9, adult: Secondary | ICD-10-CM | POA: Diagnosis not present

## 2017-03-24 DIAGNOSIS — R7303 Prediabetes: Secondary | ICD-10-CM | POA: Diagnosis not present

## 2017-03-24 DIAGNOSIS — E559 Vitamin D deficiency, unspecified: Secondary | ICD-10-CM | POA: Diagnosis not present

## 2017-03-24 DIAGNOSIS — N281 Cyst of kidney, acquired: Secondary | ICD-10-CM | POA: Diagnosis not present

## 2017-03-24 DIAGNOSIS — I700 Atherosclerosis of aorta: Secondary | ICD-10-CM | POA: Diagnosis not present

## 2017-03-24 DIAGNOSIS — R911 Solitary pulmonary nodule: Secondary | ICD-10-CM | POA: Diagnosis not present

## 2017-03-24 DIAGNOSIS — I872 Venous insufficiency (chronic) (peripheral): Secondary | ICD-10-CM | POA: Diagnosis not present

## 2017-03-24 DIAGNOSIS — Z79899 Other long term (current) drug therapy: Secondary | ICD-10-CM | POA: Diagnosis not present

## 2017-03-24 DIAGNOSIS — L989 Disorder of the skin and subcutaneous tissue, unspecified: Secondary | ICD-10-CM | POA: Diagnosis not present

## 2017-03-31 ENCOUNTER — Ambulatory Visit
Admission: RE | Admit: 2017-03-31 | Discharge: 2017-03-31 | Disposition: A | Discharge status: Home / Self Care | Payer: Medicare Other | Source: Ambulatory Visit | Attending: Family Medicine | Admitting: Family Medicine

## 2017-03-31 DIAGNOSIS — Z1231 Encounter for screening mammogram for malignant neoplasm of breast: Secondary | ICD-10-CM

## 2017-06-18 DIAGNOSIS — Z23 Encounter for immunization: Secondary | ICD-10-CM | POA: Diagnosis not present

## 2017-08-21 DIAGNOSIS — N281 Cyst of kidney, acquired: Secondary | ICD-10-CM | POA: Diagnosis not present

## 2017-08-22 DIAGNOSIS — N281 Cyst of kidney, acquired: Secondary | ICD-10-CM | POA: Diagnosis not present

## 2017-08-26 DIAGNOSIS — H401122 Primary open-angle glaucoma, left eye, moderate stage: Secondary | ICD-10-CM | POA: Diagnosis not present

## 2017-08-26 DIAGNOSIS — H401111 Primary open-angle glaucoma, right eye, mild stage: Secondary | ICD-10-CM | POA: Diagnosis not present

## 2017-08-26 DIAGNOSIS — Z961 Presence of intraocular lens: Secondary | ICD-10-CM | POA: Diagnosis not present

## 2017-08-26 DIAGNOSIS — H524 Presbyopia: Secondary | ICD-10-CM | POA: Diagnosis not present

## 2018-01-14 DIAGNOSIS — N281 Cyst of kidney, acquired: Secondary | ICD-10-CM | POA: Diagnosis not present

## 2018-01-29 DIAGNOSIS — N8111 Cystocele, midline: Secondary | ICD-10-CM | POA: Diagnosis not present

## 2018-01-29 DIAGNOSIS — N814 Uterovaginal prolapse, unspecified: Secondary | ICD-10-CM | POA: Diagnosis not present

## 2018-01-31 IMAGING — US US RENAL
1 series · 14 of 25 positions shown · non-contrast
Comparison: Multiple exams, including 11/28/2015

CLINICAL DATA: Renal cyst, six-month follow up.

EXAM:
RENAL / URINARY TRACT ULTRASOUND COMPLETE

[Series 1: us renal · 0.27mm/px · 14 of 50 slices shown]
[im 1/50]
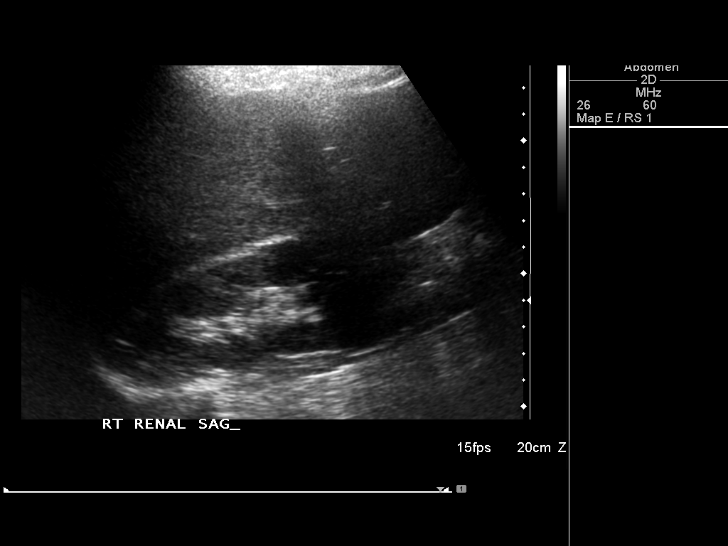
[im 5/50]
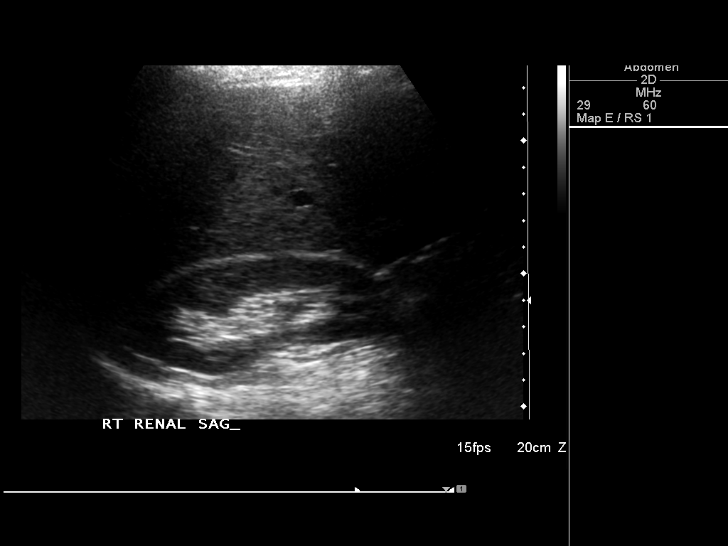
[im 9/50]
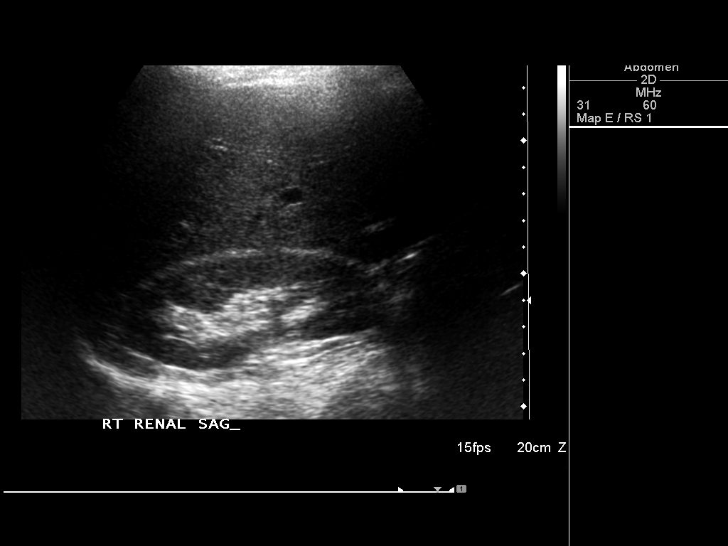
[im 13/50]
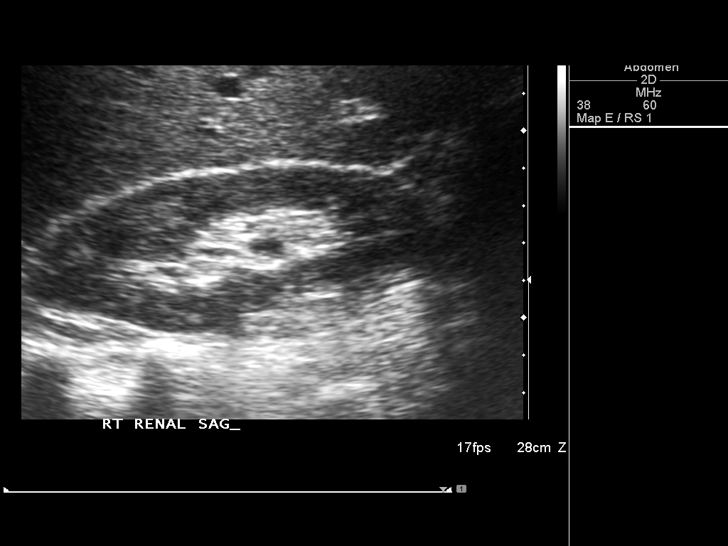
[im 17/50]
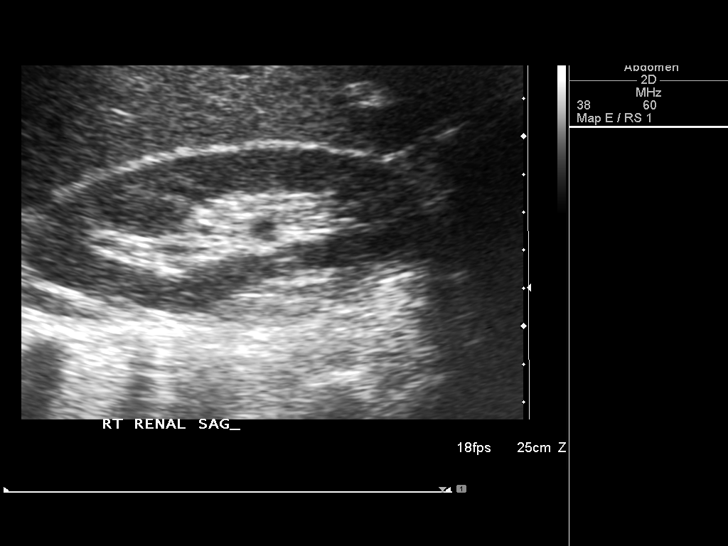
[im 19/50]
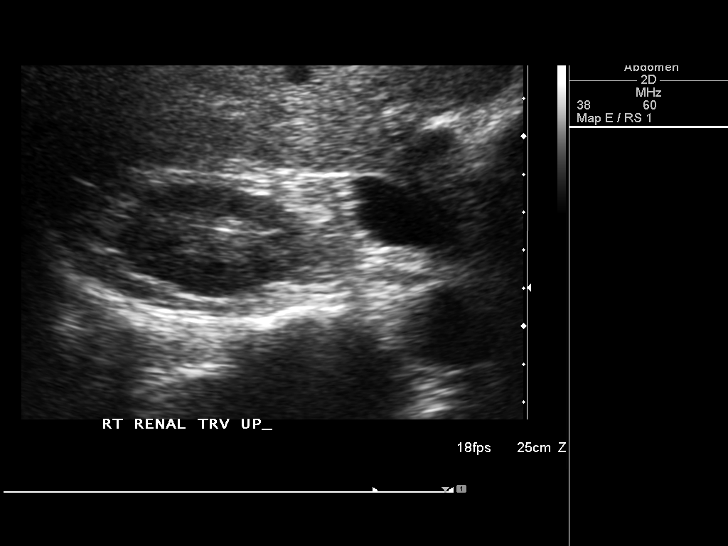
[im 23/50]
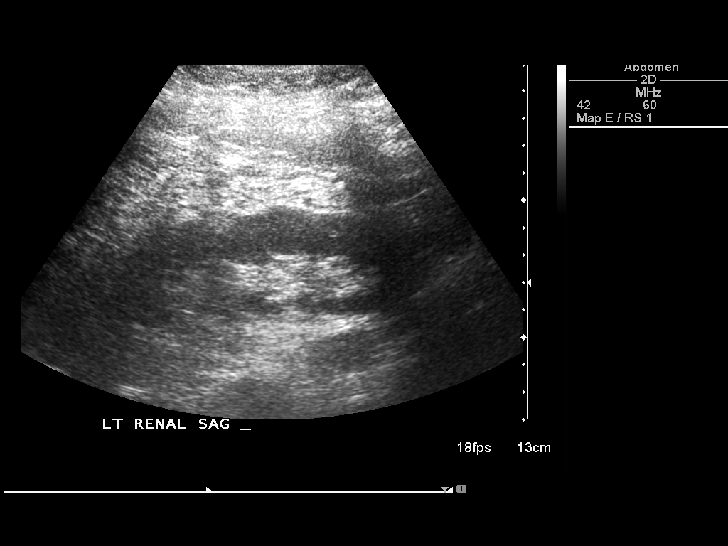
[im 27/50]
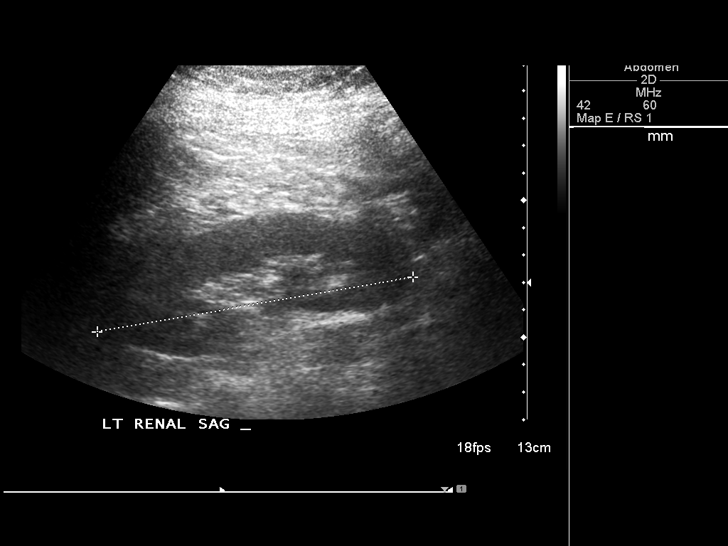
[im 31/50]
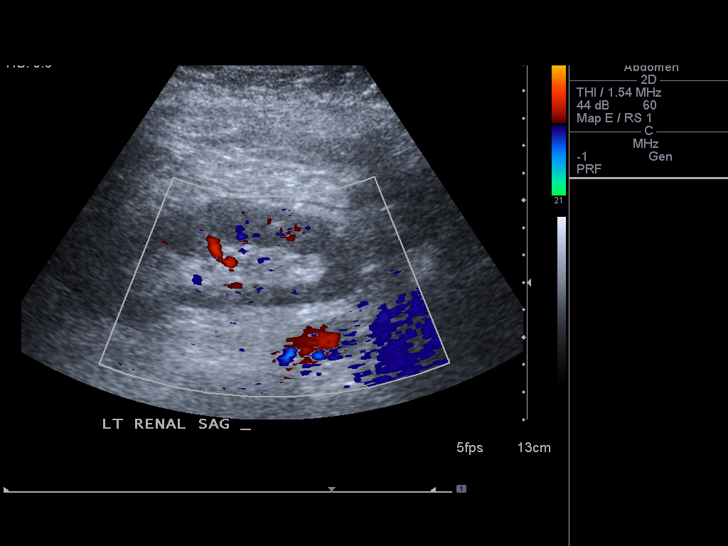
[im 33/50]
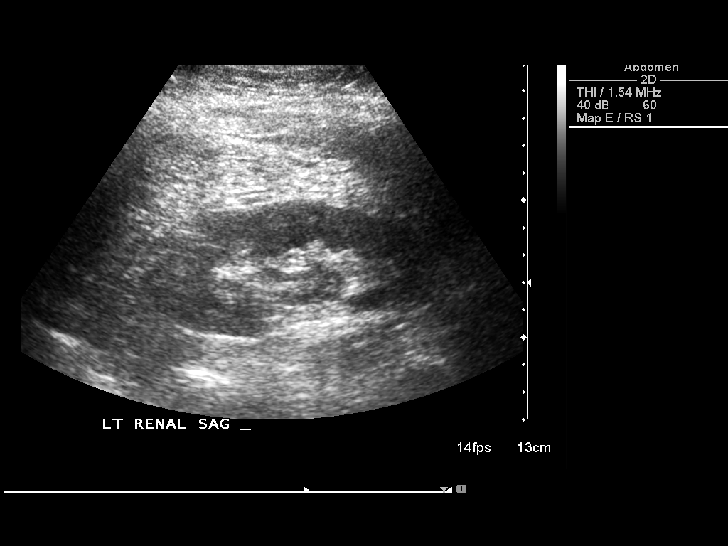
[im 37/50]
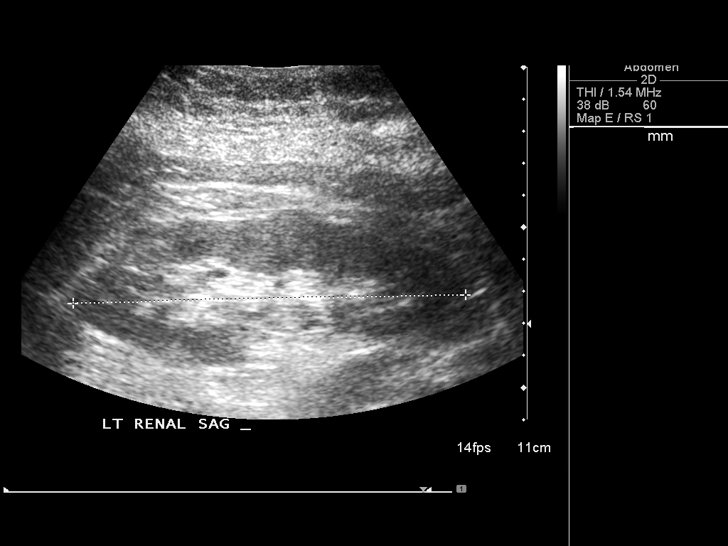
[im 41/50]
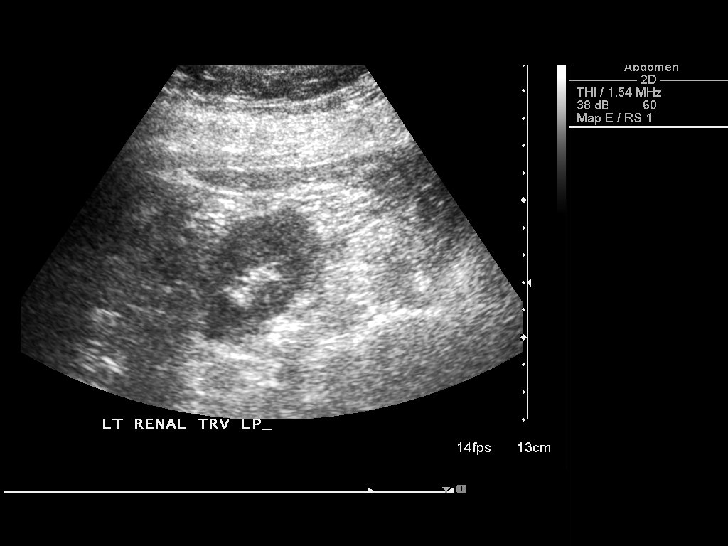
[im 45/50]
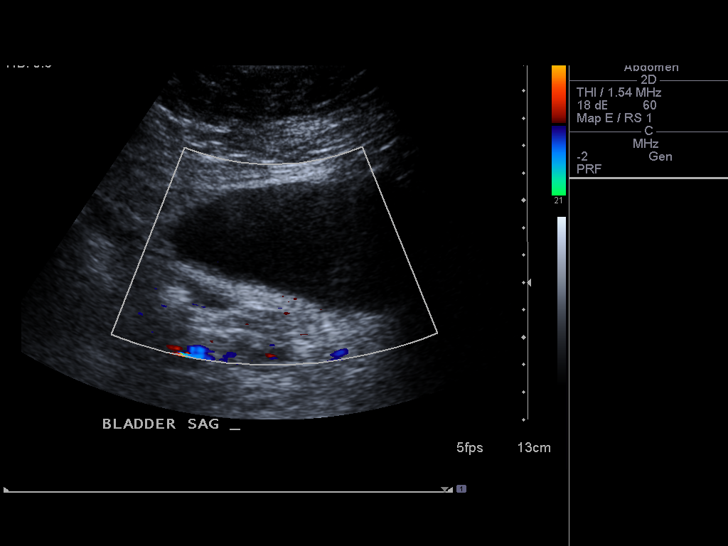
[im 50/50]
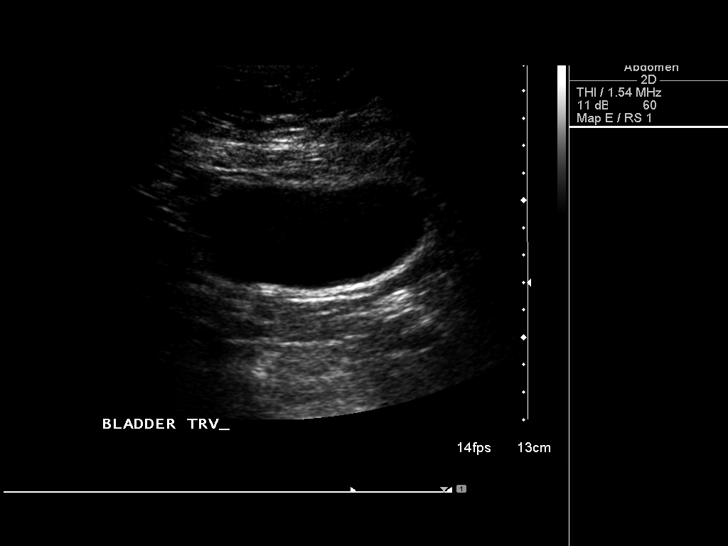

[14 of 25 positions shown; findings below may reference images not displayed]

FINDINGS: Right Kidney:

Length: 11.2 cm. Echogenicity within normal limits. No mass or
hydronephrosis visualized. 5.2 by 5.1 by 7.7 cm (volume = 110
cm^3) septated cyst, potentially with some questionable ring down
artifact along one of the septations, no gross nodularity observed
sonographically. This cyst previously measured 5.8 by 5.8 by 5.6 cm
(volume = 99 cm^3).

Left Kidney:

Length: 12.2 cm. Echogenicity within normal limits. No mass or
hydronephrosis visualized.

Bladder:

Appears normal for degree of bladder distention.
IMPRESSION: 1. Complex cystic lesion of the right kidney lower pole, with at
least several septations and possible ring down artifact along one
of the septa. This may be about 10% larger in volume than on the
prior exam. This will be at least Bosniak category 2. I would
recommend definitive Bosniak categorization ; if patient can
tolerate renal protocol MRI (with contrast if feasible), that would
be recommended for definitive characterization.

## 2018-03-04 DIAGNOSIS — N3946 Mixed incontinence: Secondary | ICD-10-CM | POA: Diagnosis not present

## 2018-03-04 DIAGNOSIS — N813 Complete uterovaginal prolapse: Secondary | ICD-10-CM | POA: Diagnosis not present

## 2018-03-04 DIAGNOSIS — M6208 Separation of muscle (nontraumatic), other site: Secondary | ICD-10-CM | POA: Diagnosis not present

## 2018-03-06 DIAGNOSIS — H401132 Primary open-angle glaucoma, bilateral, moderate stage: Secondary | ICD-10-CM | POA: Diagnosis not present

## 2018-04-22 DIAGNOSIS — N393 Stress incontinence (female) (male): Secondary | ICD-10-CM | POA: Diagnosis not present

## 2018-04-23 DIAGNOSIS — E559 Vitamin D deficiency, unspecified: Secondary | ICD-10-CM | POA: Diagnosis not present

## 2018-04-23 DIAGNOSIS — I10 Essential (primary) hypertension: Secondary | ICD-10-CM | POA: Diagnosis not present

## 2018-04-23 DIAGNOSIS — R7303 Prediabetes: Secondary | ICD-10-CM | POA: Diagnosis not present

## 2018-04-23 DIAGNOSIS — Z79899 Other long term (current) drug therapy: Secondary | ICD-10-CM | POA: Diagnosis not present

## 2018-04-24 DIAGNOSIS — D692 Other nonthrombocytopenic purpura: Secondary | ICD-10-CM | POA: Diagnosis not present

## 2018-04-24 DIAGNOSIS — I700 Atherosclerosis of aorta: Secondary | ICD-10-CM | POA: Diagnosis not present

## 2018-04-24 DIAGNOSIS — N814 Uterovaginal prolapse, unspecified: Secondary | ICD-10-CM | POA: Diagnosis not present

## 2018-04-24 DIAGNOSIS — G250 Essential tremor: Secondary | ICD-10-CM | POA: Diagnosis not present

## 2018-04-24 DIAGNOSIS — E559 Vitamin D deficiency, unspecified: Secondary | ICD-10-CM | POA: Diagnosis not present

## 2018-04-24 DIAGNOSIS — M5430 Sciatica, unspecified side: Secondary | ICD-10-CM | POA: Diagnosis not present

## 2018-04-24 DIAGNOSIS — Z0000 Encounter for general adult medical examination without abnormal findings: Secondary | ICD-10-CM | POA: Diagnosis not present

## 2018-04-24 DIAGNOSIS — N281 Cyst of kidney, acquired: Secondary | ICD-10-CM | POA: Diagnosis not present

## 2018-04-24 DIAGNOSIS — I129 Hypertensive chronic kidney disease with stage 1 through stage 4 chronic kidney disease, or unspecified chronic kidney disease: Secondary | ICD-10-CM | POA: Diagnosis not present

## 2018-04-24 DIAGNOSIS — R911 Solitary pulmonary nodule: Secondary | ICD-10-CM | POA: Diagnosis not present

## 2018-04-24 DIAGNOSIS — N182 Chronic kidney disease, stage 2 (mild): Secondary | ICD-10-CM | POA: Diagnosis not present

## 2018-04-24 DIAGNOSIS — E0590 Thyrotoxicosis, unspecified without thyrotoxic crisis or storm: Secondary | ICD-10-CM | POA: Diagnosis not present

## 2018-05-11 DIAGNOSIS — T8119XA Other postprocedural shock, initial encounter: Secondary | ICD-10-CM | POA: Diagnosis not present

## 2018-05-11 DIAGNOSIS — N838 Other noninflammatory disorders of ovary, fallopian tube and broad ligament: Secondary | ICD-10-CM | POA: Diagnosis not present

## 2018-05-11 DIAGNOSIS — E876 Hypokalemia: Secondary | ICD-10-CM | POA: Diagnosis not present

## 2018-05-11 DIAGNOSIS — N9961 Intraoperative hemorrhage and hematoma of a genitourinary system organ or structure complicating a genitourinary system procedure: Secondary | ICD-10-CM | POA: Diagnosis not present

## 2018-05-11 DIAGNOSIS — N9971 Accidental puncture and laceration of a genitourinary system organ or structure during a genitourinary system procedure: Secondary | ICD-10-CM | POA: Diagnosis not present

## 2018-05-11 DIAGNOSIS — N813 Complete uterovaginal prolapse: Secondary | ICD-10-CM | POA: Diagnosis not present

## 2018-05-11 DIAGNOSIS — I10 Essential (primary) hypertension: Secondary | ICD-10-CM | POA: Diagnosis present

## 2018-05-11 DIAGNOSIS — N3946 Mixed incontinence: Secondary | ICD-10-CM | POA: Diagnosis not present

## 2018-05-11 DIAGNOSIS — R7303 Prediabetes: Secondary | ICD-10-CM | POA: Diagnosis present

## 2018-05-11 DIAGNOSIS — N841 Polyp of cervix uteri: Secondary | ICD-10-CM | POA: Diagnosis not present

## 2018-05-11 DIAGNOSIS — R52 Pain, unspecified: Secondary | ICD-10-CM | POA: Diagnosis not present

## 2018-05-11 DIAGNOSIS — K567 Ileus, unspecified: Secondary | ICD-10-CM | POA: Diagnosis not present

## 2018-05-11 DIAGNOSIS — R110 Nausea: Secondary | ICD-10-CM | POA: Diagnosis not present

## 2018-05-11 DIAGNOSIS — N281 Cyst of kidney, acquired: Secondary | ICD-10-CM | POA: Diagnosis present

## 2018-05-11 DIAGNOSIS — N800 Endometriosis of uterus: Secondary | ICD-10-CM | POA: Diagnosis not present

## 2018-05-11 DIAGNOSIS — Z87891 Personal history of nicotine dependence: Secondary | ICD-10-CM | POA: Diagnosis not present

## 2018-05-11 DIAGNOSIS — N736 Female pelvic peritoneal adhesions (postinfective): Secondary | ICD-10-CM | POA: Diagnosis present

## 2018-05-11 DIAGNOSIS — D259 Leiomyoma of uterus, unspecified: Secondary | ICD-10-CM | POA: Diagnosis not present

## 2018-05-11 DIAGNOSIS — R40241 Glasgow coma scale score 13-15, unspecified time: Secondary | ICD-10-CM | POA: Diagnosis not present

## 2018-05-11 DIAGNOSIS — D6959 Other secondary thrombocytopenia: Secondary | ICD-10-CM | POA: Diagnosis not present

## 2018-05-11 DIAGNOSIS — Z5331 Laparoscopic surgical procedure converted to open procedure: Secondary | ICD-10-CM | POA: Diagnosis not present

## 2018-05-11 DIAGNOSIS — D62 Acute posthemorrhagic anemia: Secondary | ICD-10-CM | POA: Diagnosis not present

## 2018-05-11 DIAGNOSIS — E0590 Thyrotoxicosis, unspecified without thyrotoxic crisis or storm: Secondary | ICD-10-CM | POA: Diagnosis present

## 2018-05-11 DIAGNOSIS — H409 Unspecified glaucoma: Secondary | ICD-10-CM | POA: Diagnosis present

## 2018-05-20 DIAGNOSIS — Z466 Encounter for fitting and adjustment of urinary device: Secondary | ICD-10-CM | POA: Diagnosis not present

## 2018-05-27 DIAGNOSIS — N3000 Acute cystitis without hematuria: Secondary | ICD-10-CM | POA: Diagnosis not present

## 2018-05-27 DIAGNOSIS — N390 Urinary tract infection, site not specified: Secondary | ICD-10-CM | POA: Diagnosis not present

## 2018-07-01 DIAGNOSIS — Z8742 Personal history of other diseases of the female genital tract: Secondary | ICD-10-CM | POA: Diagnosis not present

## 2018-08-18 DIAGNOSIS — N2889 Other specified disorders of kidney and ureter: Secondary | ICD-10-CM | POA: Diagnosis not present

## 2018-08-18 DIAGNOSIS — N289 Disorder of kidney and ureter, unspecified: Secondary | ICD-10-CM | POA: Diagnosis not present

## 2018-08-25 DIAGNOSIS — N281 Cyst of kidney, acquired: Secondary | ICD-10-CM | POA: Diagnosis not present

## 2018-09-04 DIAGNOSIS — H401132 Primary open-angle glaucoma, bilateral, moderate stage: Secondary | ICD-10-CM | POA: Diagnosis not present

## 2018-09-04 DIAGNOSIS — H5203 Hypermetropia, bilateral: Secondary | ICD-10-CM | POA: Diagnosis not present

## 2018-10-08 DIAGNOSIS — M25551 Pain in right hip: Secondary | ICD-10-CM | POA: Diagnosis not present

## 2018-10-08 DIAGNOSIS — M545 Low back pain: Secondary | ICD-10-CM | POA: Diagnosis not present

## 2018-10-08 DIAGNOSIS — M5416 Radiculopathy, lumbar region: Secondary | ICD-10-CM | POA: Diagnosis not present

## 2018-12-30 DIAGNOSIS — N3946 Mixed incontinence: Secondary | ICD-10-CM | POA: Diagnosis not present

## 2019-01-27 ENCOUNTER — Other Ambulatory Visit: Payer: Self-pay | Admitting: Family Medicine

## 2019-01-27 DIAGNOSIS — Z1231 Encounter for screening mammogram for malignant neoplasm of breast: Secondary | ICD-10-CM

## 2019-03-05 DIAGNOSIS — H401132 Primary open-angle glaucoma, bilateral, moderate stage: Secondary | ICD-10-CM | POA: Diagnosis not present

## 2019-03-05 DIAGNOSIS — Z961 Presence of intraocular lens: Secondary | ICD-10-CM | POA: Diagnosis not present

## 2019-03-15 ENCOUNTER — Ambulatory Visit
Admission: RE | Admit: 2019-03-15 | Discharge: 2019-03-15 | Disposition: A | Discharge status: Home / Self Care | Payer: Medicare Other | Source: Ambulatory Visit | Attending: Family Medicine | Admitting: Family Medicine

## 2019-03-15 ENCOUNTER — Other Ambulatory Visit: Payer: Self-pay

## 2019-03-15 DIAGNOSIS — Z1231 Encounter for screening mammogram for malignant neoplasm of breast: Secondary | ICD-10-CM

## 2019-04-27 DIAGNOSIS — Z23 Encounter for immunization: Secondary | ICD-10-CM | POA: Diagnosis not present

## 2019-06-25 DIAGNOSIS — B356 Tinea cruris: Secondary | ICD-10-CM | POA: Diagnosis not present

## 2019-06-25 DIAGNOSIS — D692 Other nonthrombocytopenic purpura: Secondary | ICD-10-CM | POA: Diagnosis not present

## 2019-06-25 DIAGNOSIS — I700 Atherosclerosis of aorta: Secondary | ICD-10-CM | POA: Diagnosis not present

## 2019-06-25 DIAGNOSIS — F330 Major depressive disorder, recurrent, mild: Secondary | ICD-10-CM | POA: Diagnosis not present

## 2019-06-25 DIAGNOSIS — E559 Vitamin D deficiency, unspecified: Secondary | ICD-10-CM | POA: Diagnosis not present

## 2019-06-25 DIAGNOSIS — M2550 Pain in unspecified joint: Secondary | ICD-10-CM | POA: Diagnosis not present

## 2019-06-25 DIAGNOSIS — R7303 Prediabetes: Secondary | ICD-10-CM | POA: Diagnosis not present

## 2019-06-25 DIAGNOSIS — J309 Allergic rhinitis, unspecified: Secondary | ICD-10-CM | POA: Diagnosis not present

## 2019-06-25 DIAGNOSIS — Z0000 Encounter for general adult medical examination without abnormal findings: Secondary | ICD-10-CM | POA: Diagnosis not present

## 2019-06-25 DIAGNOSIS — G250 Essential tremor: Secondary | ICD-10-CM | POA: Diagnosis not present

## 2019-06-25 DIAGNOSIS — R911 Solitary pulmonary nodule: Secondary | ICD-10-CM | POA: Diagnosis not present

## 2019-06-25 DIAGNOSIS — I10 Essential (primary) hypertension: Secondary | ICD-10-CM | POA: Diagnosis not present

## 2019-08-10 DIAGNOSIS — M542 Cervicalgia: Secondary | ICD-10-CM | POA: Diagnosis not present

## 2019-08-10 DIAGNOSIS — M5412 Radiculopathy, cervical region: Secondary | ICD-10-CM | POA: Diagnosis not present

## 2019-08-17 DIAGNOSIS — M542 Cervicalgia: Secondary | ICD-10-CM | POA: Diagnosis not present

## 2019-08-26 DIAGNOSIS — M5022 Other cervical disc displacement, mid-cervical region, unspecified level: Secondary | ICD-10-CM | POA: Diagnosis not present

## 2019-08-26 DIAGNOSIS — M542 Cervicalgia: Secondary | ICD-10-CM | POA: Diagnosis not present

## 2019-08-26 DIAGNOSIS — M5412 Radiculopathy, cervical region: Secondary | ICD-10-CM | POA: Diagnosis not present

## 2019-08-27 ENCOUNTER — Ambulatory Visit: Payer: Medicare Other | Attending: Internal Medicine

## 2019-08-27 DIAGNOSIS — Z23 Encounter for immunization: Secondary | ICD-10-CM

## 2019-08-27 NOTE — Progress Notes (Signed)
   Covid-19 Vaccination Clinic  Name:  Caitlin Woodward    MRN: [id number] DOB: 11-May-1941  08/27/2019  Caitlin Woodward was observed post Covid-19 immunization for 15 minutes without incidence. She was provided with Vaccine Information Sheet and instruction to access the V-Safe system.   Caitlin Woodward was instructed to call 911 with any severe reactions post vaccine: Marland Kitchen Difficulty breathing  . Swelling of your face and throat  . A fast heartbeat  . A bad rash all over your body  . Dizziness and weakness    Immunizations Administered    Name Date Dose VIS Date Route   Pfizer COVID-19 Vaccine 08/27/2019  2:06 PM 0.3 mL 07/16/2019 Intramuscular   Manufacturer: [place]   Lot: [id number]   [place]: [id number]

## 2019-09-08 DIAGNOSIS — H35363 Drusen (degenerative) of macula, bilateral: Secondary | ICD-10-CM | POA: Diagnosis not present

## 2019-09-08 DIAGNOSIS — H401132 Primary open-angle glaucoma, bilateral, moderate stage: Secondary | ICD-10-CM | POA: Diagnosis not present

## 2019-09-10 DIAGNOSIS — M5022 Other cervical disc displacement, mid-cervical region, unspecified level: Secondary | ICD-10-CM | POA: Diagnosis not present

## 2019-09-10 DIAGNOSIS — M5412 Radiculopathy, cervical region: Secondary | ICD-10-CM | POA: Diagnosis not present

## 2019-09-10 DIAGNOSIS — M542 Cervicalgia: Secondary | ICD-10-CM | POA: Diagnosis not present

## 2019-09-16 ENCOUNTER — Telehealth: Payer: Self-pay | Admitting: *Deleted

## 2019-09-16 NOTE — Telephone Encounter (Signed)
Pt calling with questions regarding 2nd covid vaccine. Questions answered to pts satisfaction. Advised to CB if any other questions arise.

## 2019-09-17 ENCOUNTER — Ambulatory Visit: Payer: Medicare Other | Attending: Internal Medicine

## 2019-09-17 DIAGNOSIS — Z23 Encounter for immunization: Secondary | ICD-10-CM | POA: Insufficient documentation

## 2019-09-17 NOTE — Progress Notes (Signed)
   Covid-19 Vaccination Clinic  Name:  Caitlin Woodward    MRN: [id number] DOB: 1940-09-04  09/17/2019  [name] was observed post Covid-19 immunization for 15 minutes without incidence. She was provided with Vaccine Information Sheet and instruction to access the V-Safe system.   [name] was instructed to call 911 with any severe reactions post vaccine: Marland Kitchen Difficulty breathing  . Swelling of your face and throat  . A fast heartbeat  . A bad rash all over your body  . Dizziness and weakness    Immunizations Administered    Name Date Dose VIS Date Route   Pfizer COVID-19 Vaccine 09/17/2019  8:41 AM 0.3 mL 07/16/2019 Intramuscular   Manufacturer: [place]   Lot: [id number]   [place]: [id number]

## 2019-09-19 ENCOUNTER — Ambulatory Visit: Payer: Medicare Other

## 2019-09-27 DIAGNOSIS — M542 Cervicalgia: Secondary | ICD-10-CM | POA: Diagnosis not present

## 2019-09-27 DIAGNOSIS — M5022 Other cervical disc displacement, mid-cervical region, unspecified level: Secondary | ICD-10-CM | POA: Diagnosis not present

## 2019-09-27 DIAGNOSIS — M5412 Radiculopathy, cervical region: Secondary | ICD-10-CM | POA: Diagnosis not present

## 2019-10-11 DIAGNOSIS — M5412 Radiculopathy, cervical region: Secondary | ICD-10-CM | POA: Diagnosis not present

## 2019-10-12 DIAGNOSIS — N2889 Other specified disorders of kidney and ureter: Secondary | ICD-10-CM | POA: Diagnosis not present

## 2019-10-12 DIAGNOSIS — N281 Cyst of kidney, acquired: Secondary | ICD-10-CM | POA: Diagnosis not present

## 2019-10-18 DIAGNOSIS — M5412 Radiculopathy, cervical region: Secondary | ICD-10-CM | POA: Diagnosis not present

## 2019-10-25 DIAGNOSIS — M5412 Radiculopathy, cervical region: Secondary | ICD-10-CM | POA: Diagnosis not present

## 2019-11-01 DIAGNOSIS — M5412 Radiculopathy, cervical region: Secondary | ICD-10-CM | POA: Diagnosis not present

## 2019-11-08 DIAGNOSIS — M5412 Radiculopathy, cervical region: Secondary | ICD-10-CM | POA: Diagnosis not present

## 2019-11-15 DIAGNOSIS — N281 Cyst of kidney, acquired: Secondary | ICD-10-CM | POA: Diagnosis not present

## 2019-11-23 DIAGNOSIS — M5412 Radiculopathy, cervical region: Secondary | ICD-10-CM | POA: Diagnosis not present

## 2019-11-23 DIAGNOSIS — M542 Cervicalgia: Secondary | ICD-10-CM | POA: Diagnosis not present

## 2019-11-23 DIAGNOSIS — M5022 Other cervical disc displacement, mid-cervical region, unspecified level: Secondary | ICD-10-CM | POA: Diagnosis not present

## 2020-03-08 DIAGNOSIS — H401132 Primary open-angle glaucoma, bilateral, moderate stage: Secondary | ICD-10-CM | POA: Diagnosis not present

## 2020-03-28 ENCOUNTER — Other Ambulatory Visit: Payer: Self-pay | Admitting: Family Medicine

## 2020-03-28 DIAGNOSIS — Z1231 Encounter for screening mammogram for malignant neoplasm of breast: Secondary | ICD-10-CM

## 2020-04-13 DIAGNOSIS — B351 Tinea unguium: Secondary | ICD-10-CM | POA: Diagnosis not present

## 2020-04-13 DIAGNOSIS — B079 Viral wart, unspecified: Secondary | ICD-10-CM | POA: Diagnosis not present

## 2020-04-13 DIAGNOSIS — Z23 Encounter for immunization: Secondary | ICD-10-CM | POA: Diagnosis not present

## 2020-04-21 ENCOUNTER — Ambulatory Visit
Admission: RE | Admit: 2020-04-21 | Discharge: 2020-04-21 | Disposition: A | Discharge status: Home / Self Care | Payer: Medicare Other | Source: Ambulatory Visit | Attending: Family Medicine | Admitting: Family Medicine

## 2020-04-21 ENCOUNTER — Other Ambulatory Visit: Payer: Self-pay

## 2020-04-21 DIAGNOSIS — Z1231 Encounter for screening mammogram for malignant neoplasm of breast: Secondary | ICD-10-CM

## 2020-05-24 ENCOUNTER — Other Ambulatory Visit (HOSPITAL_COMMUNITY): Payer: Self-pay | Admitting: Internal Medicine

## 2020-05-24 ENCOUNTER — Ambulatory Visit: Payer: Medicare Other | Attending: Internal Medicine

## 2020-05-24 DIAGNOSIS — Z23 Encounter for immunization: Secondary | ICD-10-CM

## 2020-05-24 NOTE — Progress Notes (Signed)
   Covid-19 Vaccination Clinic  Name:  Laren Orama    MRN: [id number] DOB: Dec 08, 1940  05/24/2020  Ms. Duquette was observed post Covid-19 immunization for 15 minutes without incident. She was provided with Vaccine Information Sheet and instruction to access the V-Safe system.   Ms. Nolting was instructed to call 911 with any severe reactions post vaccine: Marland Kitchen Difficulty breathing  . Swelling of face and throat  . A fast heartbeat  . A bad rash all over body  . Dizziness and weakness

## 2020-06-21 DIAGNOSIS — N816 Rectocele: Secondary | ICD-10-CM | POA: Diagnosis not present

## 2020-06-21 DIAGNOSIS — R1031 Right lower quadrant pain: Secondary | ICD-10-CM | POA: Diagnosis not present

## 2020-06-21 DIAGNOSIS — R82998 Other abnormal findings in urine: Secondary | ICD-10-CM | POA: Diagnosis not present

## 2020-06-21 DIAGNOSIS — N3946 Mixed incontinence: Secondary | ICD-10-CM | POA: Diagnosis not present

## 2020-07-11 DIAGNOSIS — N8111 Cystocele, midline: Secondary | ICD-10-CM | POA: Diagnosis not present

## 2020-07-11 DIAGNOSIS — R7303 Prediabetes: Secondary | ICD-10-CM | POA: Diagnosis not present

## 2020-07-11 DIAGNOSIS — E559 Vitamin D deficiency, unspecified: Secondary | ICD-10-CM | POA: Diagnosis not present

## 2020-07-11 DIAGNOSIS — E2839 Other primary ovarian failure: Secondary | ICD-10-CM | POA: Diagnosis not present

## 2020-07-11 DIAGNOSIS — K648 Other hemorrhoids: Secondary | ICD-10-CM | POA: Diagnosis not present

## 2020-07-11 DIAGNOSIS — Z79899 Other long term (current) drug therapy: Secondary | ICD-10-CM | POA: Diagnosis not present

## 2020-07-11 DIAGNOSIS — N814 Uterovaginal prolapse, unspecified: Secondary | ICD-10-CM | POA: Diagnosis not present

## 2020-07-11 DIAGNOSIS — I700 Atherosclerosis of aorta: Secondary | ICD-10-CM | POA: Diagnosis not present

## 2020-07-11 DIAGNOSIS — G250 Essential tremor: Secondary | ICD-10-CM | POA: Diagnosis not present

## 2020-07-11 DIAGNOSIS — Z0000 Encounter for general adult medical examination without abnormal findings: Secondary | ICD-10-CM | POA: Diagnosis not present

## 2020-07-11 DIAGNOSIS — N281 Cyst of kidney, acquired: Secondary | ICD-10-CM | POA: Diagnosis not present

## 2020-07-11 DIAGNOSIS — I10 Essential (primary) hypertension: Secondary | ICD-10-CM | POA: Diagnosis not present

## 2020-07-27 ENCOUNTER — Other Ambulatory Visit: Payer: Self-pay | Admitting: Family Medicine

## 2020-07-27 DIAGNOSIS — E2839 Other primary ovarian failure: Secondary | ICD-10-CM

## 2020-10-11 DIAGNOSIS — Z961 Presence of intraocular lens: Secondary | ICD-10-CM | POA: Diagnosis not present

## 2020-10-11 DIAGNOSIS — H5203 Hypermetropia, bilateral: Secondary | ICD-10-CM | POA: Diagnosis not present

## 2020-10-11 DIAGNOSIS — H401122 Primary open-angle glaucoma, left eye, moderate stage: Secondary | ICD-10-CM | POA: Diagnosis not present

## 2020-10-23 DIAGNOSIS — M48062 Spinal stenosis, lumbar region with neurogenic claudication: Secondary | ICD-10-CM | POA: Diagnosis not present

## 2020-10-23 DIAGNOSIS — M5412 Radiculopathy, cervical region: Secondary | ICD-10-CM | POA: Diagnosis not present

## 2020-10-23 DIAGNOSIS — R0609 Other forms of dyspnea: Secondary | ICD-10-CM | POA: Diagnosis not present

## 2020-10-23 DIAGNOSIS — M48061 Spinal stenosis, lumbar region without neurogenic claudication: Secondary | ICD-10-CM | POA: Diagnosis not present

## 2020-10-23 DIAGNOSIS — L929 Granulomatous disorder of the skin and subcutaneous tissue, unspecified: Secondary | ICD-10-CM | POA: Diagnosis not present

## 2020-10-23 DIAGNOSIS — B078 Other viral warts: Secondary | ICD-10-CM | POA: Diagnosis not present

## 2020-11-13 ENCOUNTER — Ambulatory Visit (INDEPENDENT_AMBULATORY_CARE_PROVIDER_SITE_OTHER): Payer: Medicare Other | Admitting: Cardiovascular Disease

## 2020-11-13 ENCOUNTER — Other Ambulatory Visit: Payer: Self-pay

## 2020-11-13 ENCOUNTER — Encounter: Payer: Self-pay | Admitting: Cardiovascular Disease

## 2020-11-13 VITALS — BP 128/72 | HR 59 | Ht 65.0 in | Wt 209.2 lb

## 2020-11-13 DIAGNOSIS — R072 Precordial pain: Secondary | ICD-10-CM | POA: Diagnosis not present

## 2020-11-13 DIAGNOSIS — I208 Other forms of angina pectoris: Secondary | ICD-10-CM | POA: Diagnosis not present

## 2020-11-13 DIAGNOSIS — R931 Abnormal findings on diagnostic imaging of heart and coronary circulation: Secondary | ICD-10-CM | POA: Diagnosis not present

## 2020-11-13 DIAGNOSIS — R7303 Prediabetes: Secondary | ICD-10-CM

## 2020-11-13 DIAGNOSIS — E7800 Pure hypercholesterolemia, unspecified: Secondary | ICD-10-CM

## 2020-11-13 DIAGNOSIS — R079 Chest pain, unspecified: Secondary | ICD-10-CM

## 2020-11-13 NOTE — Patient Instructions (Signed)
Medication Instructions:  No changes *If you need a refill on your cardiac medications before your next appointment, please call your pharmacy*   Lab Work: None ordered If you have labs (blood work) drawn today and your tests are completely normal, you will receive your results only by: Marland Kitchen MyChart Message (if you have MyChart) OR . A paper copy in the mail If you have any lab test that is abnormal or we need to change your treatment, we will call you to review the results.  Follow-Up: At [facility], you and your health needs are our priority.  As part of our continuing mission to provide you with exceptional heart care, we have created designated Provider Care Teams.  These Care Teams include your primary Cardiologist (physician) and Advanced Practice Providers (APPs -  Physician Assistants and Nurse Practitioners) who all work together to provide you with the care you need, when you need it.  We recommend signing up for the patient portal called "MyChart".  Sign up information is provided on this After Visit Summary.  MyChart is used to connect with patients for Virtual Visits (Telemedicine).  Patients are able to view lab/test results, encounter notes, upcoming appointments, etc.  Non-urgent messages can be sent to your provider as well.   To learn more about what you can do with MyChart, go to [email or web address].    Your next appointment:   3 month(s)  The format for your next appointment:   In Person  Provider:   You may see Sanda Klein, MD or one of the following Advanced Practice Providers on your designated Care Team:    Almyra Deforest, PA-C  Fabian Sharp, [place] or   Roby Lofts, [place]    Other Instructions Your cardiac CT will be scheduled at one of the below locations:   [facility] [address] [place], [place] [address] [phone]  [facility] [address] [place], [place]  [address] [phone]  If scheduled at [facility], please arrive at the [facility] main entrance (entrance A) of [facility] 30 minutes prior to test start time. Proceed to the [facility] Radiology Department (first floor) to check-in and test prep.  If scheduled at [facility], please arrive 15 mins early for check-in and test prep.  Please follow these instructions carefully (unless otherwise directed):   On the Night Before the Test: . Be sure to Drink plenty of water. . Do not consume any caffeinated/decaffeinated beverages or chocolate 12 hours prior to your test. . Do not take any antihistamines 12 hours prior to your test.   On the Day of the Test: . Drink plenty of water until 1 hour prior to the test. . Do not eat any food 4 hours prior to the test. . You may take your regular medications prior to the test.  . HOLD Chlorothalidone morning of the test. . FEMALES- please wear underwire-free bra if available      After the Test: . Drink plenty of water. . After receiving IV contrast, you may experience a mild flushed feeling. This is normal. . On occasion, you may experience a mild rash up to 24 hours after the test. This is not dangerous. If this occurs, you can take Benadryl 25 mg and increase your fluid intake. . If you experience trouble breathing, this can be serious. If it is severe call 911 IMMEDIATELY. If it is mild, please call our office. Marland Kitchen  If you take any of these medications: Glipizide/Metformin, Avandament, Glucavance, please do not take 48 hours after completing test unless otherwise instructed.   Once we have confirmed authorization from your insurance company, we will call you to set up a date and time for your test. Based on how quickly your insurance processes prior authorizations requests, please allow up to 4 weeks to be contacted for scheduling your Cardiac CT appointment. Be advised that routine Cardiac CT  appointments could be scheduled as many as 8 weeks after your provider has ordered it.  For non-scheduling related questions, please contact the cardiac imaging nurse navigator should you have any questions/concerns: Marchia Bond, Cardiac Imaging Nurse Navigator Gordy Clement, Cardiac Imaging Nurse Navigator [facility] Direct Office Dial: [phone]   For scheduling needs, including cancellations and rescheduling, please call [place], [phone].

## 2020-11-18 NOTE — Progress Notes (Signed)
Cardiology consultation note:    Date:  11/18/2020   ID:  Caitlin Woodward, DOB 1941/05/26, MRN [id number]  PCP:  Jonathon Jordan, [place]  Cardiologist:  No primary care provider on file. NEW Advanced Practice Provider:  No care team member to display Electrophysiologist:  None       Referring MD: Jonathon Jordan, MD   Chief Complaint  Patient presents with  . Follow-up  Caitlin Woodward is a 80 y.o. female who is being seen today for the evaluation of exertional chest discomfort at the request of Jonathon Jordan, MD.   History of Present Illness:    Caitlin Woodward is a 80 y.o. female with a hx of systemic hypertension, glaucoma and a remote history of Graves' disease, who is referred for evaluation for exertional chest discomfort.  She walks on a regular basis and has noticed that frequently she develops chest tightness after a few minutes.  However, after continuing exercise, the discomfort gradually abates over the next several minutes and she is able to continue exercise without difficulty.  She reports that she had a "chemical stress test that was normal" at [facility] 15 years ago.  She denies dyspnea at rest or with usual activity, orthopnea, PND, edema, claudication, focal neurological events.  She has not had any significant weight changes.  She denies falls, dizziness, syncope or palpitations.  She has never smoked and she does not have diabetes mellitus.  Her family history is significant for stroke in her father who died of dementia.  Her mother was still alive at age 19 and has hypertension and glaucoma.  She has a younger brother that died of colon cancer and also had hypertension.  There is no family history of coronary disease.  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  She returned to [employer] after retiring.  Past Medical History:  Diagnosis Date  . Glaucoma   . Graves disease   .  Hypertension   . Sciatica   . Seasonal allergies     Past Surgical History:  Procedure Laterality Date  . BREAST EXCISIONAL BIOPSY Left 1967   benign  . BREAST LUMPECTOMY     non cancerous  . CYST EXCISION     foot  . DILATION AND CURETTAGE OF UTERUS     x 3    Current Medications: Current Meds  Medication Sig  . aspirin 81 MG tablet Take 81 mg by mouth every other day. Mondays Wednesdays and Fridays  . azelastine (ASTELIN) 0.1 % nasal spray Place into the nose.  . cetirizine (ZYRTEC) 10 MG tablet Take 10 mg by mouth daily as needed for allergies.  . chlorthalidone (HYGROTON) 25 MG tablet Take 25 mg by mouth daily.   . ciclopirox (PENLAC) 8 % solution Apply 1 drop topically as needed.  Marland Kitchen COVID-19 mRNA vaccine, Pfizer, 30 MCG/0.3ML injection INJECT AS DIRECTED  . dorzolamide (TRUSOPT) 2 % ophthalmic solution Place 1 drop into both eyes 2 (two) times daily.  . fexofenadine (ALLEGRA) 180 MG tablet Take by mouth.  . latanoprost (XALATAN) 0.005 % ophthalmic solution 1 drop at bedtime.  . Multiple Vitamin (MULTIVITAMIN) tablet Take 1 tablet by mouth daily.     Allergies:   Codeine   Social History   Socioeconomic History  . Marital status: Married    Spouse name: Not on file  . Number of children: Not on file  .  Years of education: Not on file  . Highest education level: Not on file  Occupational History  . Not on file  Tobacco Use  . Smoking status: Former Smoker    Years: 2.00    Quit date: 07/09/1960    Years since quitting: 60.4  . Smokeless tobacco: Never Used  Substance and Sexual Activity  . Alcohol use: Yes    Alcohol/week: 0.0 standard drinks    Comment: wine - 2-3 drinks a week  . Drug use: No  . Sexual activity: Not on file  Other Topics Concern  . Not on file  Social History Narrative  . Not on file   Social Determinants of Health   Financial Resource Strain: Not on file  Food Insecurity: Not on file  Transportation Needs: Not on file  Physical  Activity: Not on file  Stress: Not on file  Social Connections: Not on file     Family History: The patient's family history includes Breast cancer in her paternal aunt.  Her father had a stroke and died of dementia at age 48.  Her mother is alive at age 82, has hypertension as does one of her brothers.  ROS:   Please see the history of present illness.     All other systems reviewed and are negative.  EKGs/Labs/Other Studies Reviewed:    The following studies were reviewed today: Notes from Dr. Stephanie Acre, including lab tests and a couple of electrocardiograms.  The tracing from 07/16/2010 shows sinus bradycardia 57 bpm with diffuse nonspecific T wave flattening.  The tracing from 10/23/2020 shows sinus rhythm with occasional PACs left anterior fascicular block, specific T wave changes.  The QTC is 435 ms.  EKG:  EKG is  ordered today.  The ekg ordered today demonstrates sinus bradycardia, left axis deviation not quite meeting criteria for left anterior fascicular block, pulmonary disease pattern, nonspecific T wave flattening, QTC 441 ms.  Recent Labs: No results found for requested labs within last 8760 hours.  Recent Lipid Panel No results found for: CHOL, TRIG, HDL, CHOLHDL, VLDL, LDLCALC, LDLDIRECT  07/11/2020  A1c 6.3% Hemoglobin 13.4 Glucose 97, creatinine 1.0, potassium 3.6, normal liver function tests, TSH 1.92, cholesterol 208, HDL 78, triglycerides 77, calculated LDL 116  Risk Assessment/Calculations:       Physical Exam:    VS:  BP 128/72   Pulse (!) 59   Ht 5\' 5"  (1.651 m)   Wt 209 lb 3.2 oz (94.9 kg)   BMI 34.81 kg/m     Wt Readings from Last 3 Encounters:  11/13/20 209 lb 3.2 oz (94.9 kg)  11/14/15 213 lb (96.6 kg)  07/10/15 215 lb (97.5 kg)     GEN: Moderately obese well nourished, well developed in no acute distress HEENT: Normal NECK: No JVD; No carotid bruits LYMPHATICS: No lymphadenopathy CARDIAC: RRR, no murmurs, rubs, gallops RESPIRATORY:   Clear to auscultation without rales, wheezing or rhonchi  ABDOMEN: Soft, non-tender, non-distended MUSCULOSKELETAL:  No edema; No deformity  SKIN: Warm and dry NEUROLOGIC:  Alert and oriented x 3 PSYCHIATRIC:  Normal affect   ASSESSMENT:    1. Angina of effort ([place])   2. Precordial pain   3. Hypercholesterolemia   4. Prediabetes   5. Abnormal findings on diagnostic imaging of heart and coronary circulation     PLAN:    In order of problems listed above:  1. Chest discomfort: She appears to be describing "walk-through angina".  ECG changes are nonspecific.  She does not have  any symptoms at rest.  She has very limited risk factors, other than her age.  She has minimally elevated LDL cholesterol with an excellent HDL and has a hemoglobin A1c in the prediabetes range..  She has normal renal function.  Her baseline heart rate is relatively slow at baseline and I believe she would be a great candidate for coronary CT angiography. 2. HLP: If coronary disease or high calcium score is identified, recommend starting treatment with a statin to a target LDL of 70 or less.  Otherwise focus mostly on lifestyle changes to improve her risk factor profile. 3. Prediabetes: Does not require medications.  Could consider Metformin.  Focus on weight loss, continue regular exercise, diet low in sweets and carbohydrates with high glycemic index.        Medication Adjustments/Labs and Tests Ordered: Current medicines are reviewed at length with the patient today.  Concerns regarding medicines are outlined above.  Orders Placed This Encounter  Procedures  . CT CORONARY MORPH W/CTA COR W/SCORE W/CA W/CM &/OR WO/CM  . CT CORONARY FRACTIONAL FLOW RESERVE DATA PREP  . CT CORONARY FRACTIONAL FLOW RESERVE FLUID ANALYSIS  . Basic metabolic panel  . EKG 12-Lead   No orders of the defined types were placed in this encounter.   Patient Instructions  Medication Instructions:  No changes *If you need a refill  on your cardiac medications before your next appointment, please call your pharmacy*   Lab Work: None ordered If you have labs (blood work) drawn today and your tests are completely normal, you will receive your results only by: Marland Kitchen MyChart Message (if you have MyChart) OR . A paper copy in the mail If you have any lab test that is abnormal or we need to change your treatment, we will call you to review the results.  Follow-Up: At [facility], you and your health needs are our priority.  As part of our continuing mission to provide you with exceptional heart care, we have created designated Provider Care Teams.  These Care Teams include your primary Cardiologist (physician) and Advanced Practice Providers (APPs -  Physician Assistants and Nurse Practitioners) who all work together to provide you with the care you need, when you need it.  We recommend signing up for the patient portal called "MyChart".  Sign up information is provided on this After Visit Summary.  MyChart is used to connect with patients for Virtual Visits (Telemedicine).  Patients are able to view lab/test results, encounter notes, upcoming appointments, etc.  Non-urgent messages can be sent to your provider as well.   To learn more about what you can do with MyChart, go to [email or web address].    Your next appointment:   3 month(s)  The format for your next appointment:   In Person  Provider:   You may see Sanda Klein, MD or one of the following Advanced Practice Providers on your designated Care Team:    Almyra Deforest, PA-C  Fabian Sharp, [place] or   Roby Lofts, [place]    Other Instructions Your cardiac CT will be scheduled at one of the below locations:   [facility] [address] [place], [place] [address] [phone]  [place] [address] [place], [place] [address] [phone]  If scheduled at [facility], please  arrive at the [facility] main entrance (entrance A) of [facility] 30 minutes prior to test start time. Proceed to the [facility] Radiology  Department (first floor) to check-in and test prep.  If scheduled at [facility], please arrive 15 mins early for check-in and test prep.  Please follow these instructions carefully (unless otherwise directed):   On the Night Before the Test: . Be sure to Drink plenty of water. . Do not consume any caffeinated/decaffeinated beverages or chocolate 12 hours prior to your test. . Do not take any antihistamines 12 hours prior to your test.   On the Day of the Test: . Drink plenty of water until 1 hour prior to the test. . Do not eat any food 4 hours prior to the test. . You may take your regular medications prior to the test.  . HOLD Chlorothalidone morning of the test. . FEMALES- please wear underwire-free bra if available      After the Test: . Drink plenty of water. . After receiving IV contrast, you may experience a mild flushed feeling. This is normal. . On occasion, you may experience a mild rash up to 24 hours after the test. This is not dangerous. If this occurs, you can take Benadryl 25 mg and increase your fluid intake. . If you experience trouble breathing, this can be serious. If it is severe call 911 IMMEDIATELY. If it is mild, please call our office. . If you take any of these medications: Glipizide/Metformin, Avandament, Glucavance, please do not take 48 hours after completing test unless otherwise instructed.   Once we have confirmed authorization from your insurance company, we will call you to set up a date and time for your test. Based on how quickly your insurance processes prior authorizations requests, please allow up to 4 weeks to be contacted for scheduling your Cardiac CT appointment. Be advised that routine Cardiac CT appointments could be scheduled as many as 8 weeks after your provider has  ordered it.  For non-scheduling related questions, please contact the cardiac imaging nurse navigator should you have any questions/concerns: Marchia Bond, Cardiac Imaging Nurse Navigator Gordy Clement, Cardiac Imaging Nurse Navigator [facility] Direct Office Dial: [phone]   For scheduling needs, including cancellations and rescheduling, please call [place], [phone].       Signed, Sanda Klein, MD  11/18/2020 4:57 PM    [place]

## 2020-11-22 DIAGNOSIS — H401122 Primary open-angle glaucoma, left eye, moderate stage: Secondary | ICD-10-CM | POA: Diagnosis not present

## 2020-11-29 DIAGNOSIS — H401112 Primary open-angle glaucoma, right eye, moderate stage: Secondary | ICD-10-CM | POA: Diagnosis not present

## 2020-12-08 ENCOUNTER — Other Ambulatory Visit: Payer: Self-pay

## 2020-12-08 ENCOUNTER — Ambulatory Visit
Admission: RE | Admit: 2020-12-08 | Discharge: 2020-12-08 | Disposition: A | Discharge status: Home / Self Care | Payer: Medicare Other | Source: Ambulatory Visit | Attending: Family Medicine | Admitting: Family Medicine

## 2020-12-08 DIAGNOSIS — E2839 Other primary ovarian failure: Secondary | ICD-10-CM

## 2020-12-08 DIAGNOSIS — Z780 Asymptomatic menopausal state: Secondary | ICD-10-CM | POA: Diagnosis not present

## 2020-12-13 ENCOUNTER — Telehealth (HOSPITAL_COMMUNITY): Payer: Self-pay | Admitting: *Deleted

## 2020-12-13 ENCOUNTER — Encounter (HOSPITAL_COMMUNITY): Payer: Self-pay | Admitting: *Deleted

## 2020-12-13 NOTE — Telephone Encounter (Signed)
Reaching out to patient to offer assistance regarding upcoming cardiac imaging study; pt verbalizes understanding of appt on May 19 at 1:45pm, parking situation and where to check in, pre-test NPO status and medications ordered, and verified current allergies; name and call back number provided for further questions should they arise  Gordy Clement RN Navigator Cardiac [place] [facility] [phone] office [phone] cell

## 2020-12-15 DIAGNOSIS — R079 Chest pain, unspecified: Secondary | ICD-10-CM | POA: Diagnosis not present

## 2020-12-15 LAB — BASIC METABOLIC PANEL
BUN/Creatinine Ratio: 16 (ref 12–28)
BUN: 16 mg/dL (ref 8–27)
CO2: 27 mmol/L (ref 20–29)
Calcium: 9.6 mg/dL (ref 8.7–10.3)
Chloride: 99 mmol/L (ref 96–106)
Creatinine, Ser: 1.02 mg/dL — ABNORMAL HIGH (ref 0.57–1.00)
Glucose: 98 mg/dL (ref 65–99)
Potassium: 3.6 mmol/L (ref 3.5–5.2)
Sodium: 140 mmol/L (ref 134–144)
eGFR: 55 mL/min/{1.73_m2} — ABNORMAL LOW (ref >59)

## 2020-12-21 ENCOUNTER — Other Ambulatory Visit: Payer: Self-pay

## 2020-12-21 ENCOUNTER — Ambulatory Visit (HOSPITAL_COMMUNITY)
Admission: RE | Admit: 2020-12-21 | Discharge: 2020-12-21 | Disposition: A | Discharge status: Home / Self Care | Payer: Medicare Other | Source: Ambulatory Visit | Attending: Cardiovascular Disease | Admitting: Cardiovascular Disease

## 2020-12-21 DIAGNOSIS — R072 Precordial pain: Secondary | ICD-10-CM | POA: Diagnosis not present

## 2020-12-21 MED ORDER — IOHEXOL 350 MG/ML SOLN
95.0000 mL | Freq: Once | INTRAVENOUS | Status: AC | PRN
  Administered 2020-12-21: 95 mL via INTRAVENOUS

## 2020-12-21 MED ORDER — NITROGLYCERIN 0.4 MG SL SUBL
SUBLINGUAL_TABLET | SUBLINGUAL | Status: AC
  Filled 2020-12-21: qty 2

## 2020-12-21 MED ORDER — NITROGLYCERIN 0.4 MG SL SUBL
0.8000 mg | SUBLINGUAL_TABLET | Freq: Once | SUBLINGUAL | Status: AC
  Administered 2020-12-21: 0.8 mg via SUBLINGUAL

## 2020-12-22 ENCOUNTER — Other Ambulatory Visit: Payer: Self-pay | Admitting: *Deleted

## 2020-12-22 DIAGNOSIS — R0602 Shortness of breath: Secondary | ICD-10-CM

## 2021-01-05 DIAGNOSIS — R935 Abnormal findings on diagnostic imaging of other abdominal regions, including retroperitoneum: Secondary | ICD-10-CM | POA: Diagnosis not present

## 2021-01-05 DIAGNOSIS — N2889 Other specified disorders of kidney and ureter: Secondary | ICD-10-CM | POA: Diagnosis not present

## 2021-01-09 ENCOUNTER — Encounter: Payer: Self-pay | Admitting: Internal Medicine

## 2021-01-09 DIAGNOSIS — R7303 Prediabetes: Secondary | ICD-10-CM | POA: Diagnosis not present

## 2021-01-09 DIAGNOSIS — I10 Essential (primary) hypertension: Secondary | ICD-10-CM | POA: Diagnosis not present

## 2021-01-17 ENCOUNTER — Ambulatory Visit: Payer: Medicare Other | Attending: Internal Medicine

## 2021-01-17 DIAGNOSIS — Z23 Encounter for immunization: Secondary | ICD-10-CM

## 2021-01-17 NOTE — Progress Notes (Signed)
   Covid-19 Vaccination Clinic  Name:  Caitlin Woodward    MRN: [id number] DOB: 28-Oct-1940  01/17/2021  [name] was observed post Covid-19 immunization for 15 minutes without incident. She was provided with Vaccine Information Sheet and instruction to access the V-Safe system.   [name] was instructed to call 911 with any severe reactions post vaccine: Difficulty breathing  Swelling of face and throat  A fast heartbeat  A bad rash all over body  Dizziness and weakness   Immunizations Administered     Name Date Dose VIS Date Route   PFIZER Comrnaty(Gray TOP) Covid-19 Vaccine 01/17/2021 12:42 PM 0.3 mL 07/13/2020 Intramuscular   Manufacturer: [employer]   Lot: [id number]   [place]: [place], PharmD, MBA Clinical Acute Care Pharmacist

## 2021-01-23 ENCOUNTER — Other Ambulatory Visit (HOSPITAL_COMMUNITY): Payer: Self-pay

## 2021-01-23 MED ORDER — COVID-19 MRNA VAC-TRIS(PFIZER) 30 MCG/0.3ML IM SUSP
INTRAMUSCULAR | 0 refills | Status: AC
  Filled 2021-01-23: qty 0.3, 17d supply, fill #0

## 2021-01-26 ENCOUNTER — Other Ambulatory Visit (HOSPITAL_COMMUNITY): Payer: Self-pay

## 2021-01-29 DIAGNOSIS — N281 Cyst of kidney, acquired: Secondary | ICD-10-CM | POA: Diagnosis not present

## 2021-02-07 NOTE — Progress Notes (Signed)
Cardiology consultation note:    Date:  02/07/2021   ID:  Caitlin Woodward, DOB 04/23/1941, MRN [id number]  PCP:  Jonathon Jordan, [place]  Cardiologist:  None NEW Advanced Practice Provider:  No care team member to display Electrophysiologist:  None       Referring MD: Jonathon Jordan, MD   No chief complaint on file. Caitlin Woodward is a 80 y.o. female who was referred for the evaluation of exertional chest discomfort at the request of Jonathon Jordan, MD.   History of Present Illness:    Caitlin Woodward is a 80 y.o. female with a hx of systemic hypertension, glaucoma and a remote history of Graves' disease, who is referred for evaluation for exertional chest discomfort.  She underwent a coronary CTA in May 2022, with normal findings (no stenoses, calcium score was zero).  Symptoms have since resolved.  The patient specifically denies any chest pain at rest or with exertion, dyspnea at rest or with exertion, orthopnea, paroxysmal nocturnal dyspnea, syncope, palpitations, focal neurological deficits, intermittent claudication, lower extremity edema, unexplained weight gain, cough, hemoptysis or wheezing.  Her mom has also been my patient (she is now 82 yo).  She has never smoked and she does not have diabetes mellitus.  Her family history is significant for stroke in her father who died of dementia.  Her mother was still alive at age 48 and has hypertension and glaucoma.  She has a younger brother that died of colon cancer and also had hypertension.  There is no family history of coronary disease.  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  She returned to [employer] after retiring.  Past Medical History:  Diagnosis Date   Glaucoma    Graves disease    Hypertension    Sciatica    Seasonal allergies     Past Surgical History:  Procedure Laterality Date   BREAST EXCISIONAL BIOPSY Left 1967   benign    BREAST LUMPECTOMY     non cancerous   CYST EXCISION     foot   DILATION AND CURETTAGE OF UTERUS     x 3    Current Medications: No outpatient medications have been marked as taking for the 02/08/21 encounter (Appointment) with Dorice Stiggers, Dani Gobble, MD.     Allergies:   Amoxicillin-pot clavulanate and Codeine   Social History   Socioeconomic History   Marital status: Married    Spouse name: Not on file   Number of children: Not on file   Years of education: Not on file   Highest education level: Not on file  Occupational History   Not on file  Tobacco Use   Smoking status: Former    Years: 2.00    Pack years: 0.00    Types: Cigarettes    Quit date: 07/09/1960    Years since quitting: 60.6   Smokeless tobacco: Never  Substance and Sexual Activity   Alcohol use: Yes    Alcohol/week: 0.0 standard drinks    Comment: wine - 2-3 drinks a week   Drug use: No   Sexual activity: Not on file  Other Topics Concern   Not on file  Social History Narrative   Not on file   Social Determinants of Health   Financial Resource Strain: Not on file  Food Insecurity: Not on file  Transportation Needs: Not on file  Physical Activity: Not on file  Stress: Not on file  Social Connections: Not on file     Family History: The patient's family history includes Breast cancer in her paternal aunt.  Her father had a stroke and died of dementia at age 94.  Her mother is alive at age 21, has hypertension as does one of her brothers.  ROS:   Please see the history of present illness.     All other systems reviewed and are negative.  EKGs/Labs/Other Studies Reviewed:    The following studies were reviewed today: Coronary CTA 12/21/2020: 1. Coronary calcium score of 0. This was 0 percentile for age and sex matched control.   2. Normal coronary origin with right dominance.   3. No evidence of CAD.   Notes from Dr. Stephanie Acre, including lab tests and a couple of electrocardiograms.  The  tracing from 07/16/2010 shows sinus bradycardia 57 bpm with diffuse nonspecific T wave flattening.  The tracing from 10/23/2020 shows sinus rhythm with occasional PACs left anterior fascicular block, specific T wave changes.  The QTC is 435 ms.  EKG:  EKG is  ordered today.  The ekg ordered today demonstrates sinus bradycardia, left axis deviation not quite meeting criteria for left anterior fascicular block, pulmonary disease pattern, nonspecific T wave flattening, QTC 441 ms.  Recent Labs: 12/15/2020: BUN 16; Creatinine, Ser 1.02; Potassium 3.6; Sodium 140  Recent Lipid Panel No results found for: CHOL, TRIG, HDL, CHOLHDL, VLDL, LDLCALC, LDLDIRECT  07/11/2020  A1c 6.3% Hemoglobin 13.4 Glucose 97, creatinine 1.0, potassium 3.6, normal liver function tests, TSH 1.92, cholesterol 208, HDL 78, triglycerides 77, calculated LDL 116  Risk Assessment/Calculations:       Physical Exam:    VS:  There were no vitals taken for this visit.    Wt Readings from Last 3 Encounters:  11/13/20 209 lb 3.2 oz (94.9 kg)  11/14/15 213 lb (96.6 kg)  07/10/15 215 lb (97.5 kg)     GEN: Moderately obese well nourished, well developed in no acute distress HEENT: Normal NECK: No JVD; No carotid bruits LYMPHATICS: No lymphadenopathy CARDIAC: RRR, no murmurs, rubs, gallops RESPIRATORY:  Clear to auscultation without rales, wheezing or rhonchi  ABDOMEN: Soft, non-tender, non-distended MUSCULOSKELETAL:  No edema; No deformity  SKIN: Warm and dry NEUROLOGIC:  Alert and oriented x 3 PSYCHIATRIC:  Normal affect   ASSESSMENT:    1. Precordial pain   2. Hypercholesterolemia   3. Prediabetes     PLAN:    In order of problems listed above:  Chest discomfort: normal coronary CTA, calcium score zero. Very low risk. Echo pending, for completeness of evaluation, but suspect non-cardiac cause of symptoms. If symptoms recur, consider evaluation for noncoronary causes of chest pain. HLP: minimally elevated  LDL, no indication for pharmacological therapy. Prediabetes: Does not require medications.  Could consider Metformin.  Focus on weight loss, continue regular exercise, diet low in sweets and carbohydrates with high glycemic index.        Medication Adjustments/Labs and Tests Ordered: Current medicines are reviewed at length with the patient today.  Concerns regarding medicines are outlined above.  No orders of the defined types were placed in this encounter.  No orders of the defined types were placed in this encounter.   There are no Patient Instructions on file for this visit.   Signed, Sanda Klein, MD  02/07/2021 8:10 AM    [place]

## 2021-02-08 ENCOUNTER — Other Ambulatory Visit: Payer: Self-pay

## 2021-02-08 ENCOUNTER — Ambulatory Visit (INDEPENDENT_AMBULATORY_CARE_PROVIDER_SITE_OTHER): Payer: Medicare Other | Admitting: Cardiovascular Disease

## 2021-02-08 ENCOUNTER — Encounter: Payer: Self-pay | Admitting: Cardiovascular Disease

## 2021-02-08 VITALS — BP 110/70 | HR 57 | Ht 65.0 in | Wt 207.0 lb

## 2021-02-08 DIAGNOSIS — R7303 Prediabetes: Secondary | ICD-10-CM | POA: Diagnosis not present

## 2021-02-08 DIAGNOSIS — I208 Other forms of angina pectoris: Secondary | ICD-10-CM | POA: Diagnosis not present

## 2021-02-08 DIAGNOSIS — E7800 Pure hypercholesterolemia, unspecified: Secondary | ICD-10-CM | POA: Diagnosis not present

## 2021-02-08 DIAGNOSIS — R072 Precordial pain: Secondary | ICD-10-CM

## 2021-02-08 NOTE — Patient Instructions (Signed)

## 2021-02-13 ENCOUNTER — Other Ambulatory Visit: Payer: Self-pay

## 2021-02-13 ENCOUNTER — Ambulatory Visit (HOSPITAL_COMMUNITY): Payer: Medicare Other | Attending: Cardiovascular Disease

## 2021-02-13 DIAGNOSIS — R0602 Shortness of breath: Secondary | ICD-10-CM | POA: Insufficient documentation

## 2021-02-14 LAB — ECHOCARDIOGRAM COMPLETE
Area-P 1/2: 2.99 cm2
S' Lateral: 2.9 cm

## 2021-03-19 ENCOUNTER — Other Ambulatory Visit: Payer: Self-pay | Admitting: Family Medicine

## 2021-03-19 DIAGNOSIS — Z1231 Encounter for screening mammogram for malignant neoplasm of breast: Secondary | ICD-10-CM

## 2021-04-10 DIAGNOSIS — M5412 Radiculopathy, cervical region: Secondary | ICD-10-CM | POA: Diagnosis not present

## 2021-04-10 DIAGNOSIS — M5022 Other cervical disc displacement, mid-cervical region, unspecified level: Secondary | ICD-10-CM | POA: Diagnosis not present

## 2021-04-27 ENCOUNTER — Ambulatory Visit: Payer: Medicare Other | Attending: Internal Medicine

## 2021-04-27 DIAGNOSIS — Z23 Encounter for immunization: Secondary | ICD-10-CM

## 2021-04-27 NOTE — Progress Notes (Signed)
   Covid-19 Vaccination Clinic  Name:  APOLONIA ELLWOOD    MRN: [id number] DOB: 1941/02/10  04/27/2021  Ms. Fahringer was observed post Covid-19 immunization for 15 minutes without incident. She was provided with Vaccine Information Sheet and instruction to access the V-Safe system.   Ms. Sieloff was instructed to call 911 with any severe reactions post vaccine: Difficulty breathing  Swelling of face and throat  A fast heartbeat  A bad rash all over body  Dizziness and weakness

## 2021-05-01 ENCOUNTER — Other Ambulatory Visit: Payer: Self-pay

## 2021-05-01 ENCOUNTER — Ambulatory Visit
Admission: RE | Admit: 2021-05-01 | Discharge: 2021-05-01 | Disposition: A | Discharge status: Home / Self Care | Payer: Medicare Other | Source: Ambulatory Visit | Attending: Family Medicine | Admitting: Family Medicine

## 2021-05-01 DIAGNOSIS — Z1231 Encounter for screening mammogram for malignant neoplasm of breast: Secondary | ICD-10-CM

## 2021-05-04 ENCOUNTER — Ambulatory Visit: Payer: Medicare Other

## 2021-05-04 ENCOUNTER — Other Ambulatory Visit (HOSPITAL_BASED_OUTPATIENT_CLINIC_OR_DEPARTMENT_OTHER): Payer: Self-pay

## 2021-05-04 DIAGNOSIS — Z23 Encounter for immunization: Secondary | ICD-10-CM | POA: Diagnosis not present

## 2021-05-04 MED ORDER — COVID-19MRNA BIVAL VACC PFIZER 30 MCG/0.3ML IM SUSP
INTRAMUSCULAR | 0 refills | Status: AC
  Filled 2021-05-04: qty 0.3, 1d supply, fill #0

## 2021-05-10 DIAGNOSIS — R351 Nocturia: Secondary | ICD-10-CM | POA: Diagnosis not present

## 2021-05-10 DIAGNOSIS — N3281 Overactive bladder: Secondary | ICD-10-CM | POA: Diagnosis not present

## 2021-05-22 DIAGNOSIS — Z23 Encounter for immunization: Secondary | ICD-10-CM | POA: Diagnosis not present

## 2021-07-04 DIAGNOSIS — J069 Acute upper respiratory infection, unspecified: Secondary | ICD-10-CM | POA: Diagnosis not present

## 2021-07-04 DIAGNOSIS — Z03818 Encounter for observation for suspected exposure to other biological agents ruled out: Secondary | ICD-10-CM | POA: Diagnosis not present

## 2021-07-17 DIAGNOSIS — H401132 Primary open-angle glaucoma, bilateral, moderate stage: Secondary | ICD-10-CM | POA: Diagnosis not present

## 2021-08-23 DIAGNOSIS — I10 Essential (primary) hypertension: Secondary | ICD-10-CM | POA: Diagnosis not present

## 2021-08-23 DIAGNOSIS — E559 Vitamin D deficiency, unspecified: Secondary | ICD-10-CM | POA: Diagnosis not present

## 2021-08-23 DIAGNOSIS — R252 Cramp and spasm: Secondary | ICD-10-CM | POA: Diagnosis not present

## 2021-08-23 DIAGNOSIS — R7303 Prediabetes: Secondary | ICD-10-CM | POA: Diagnosis not present

## 2021-08-23 DIAGNOSIS — F330 Major depressive disorder, recurrent, mild: Secondary | ICD-10-CM | POA: Diagnosis not present

## 2021-08-23 DIAGNOSIS — G250 Essential tremor: Secondary | ICD-10-CM | POA: Diagnosis not present

## 2021-08-23 DIAGNOSIS — Z0000 Encounter for general adult medical examination without abnormal findings: Secondary | ICD-10-CM | POA: Diagnosis not present

## 2021-08-23 DIAGNOSIS — Z79899 Other long term (current) drug therapy: Secondary | ICD-10-CM | POA: Diagnosis not present

## 2021-08-23 DIAGNOSIS — I700 Atherosclerosis of aorta: Secondary | ICD-10-CM | POA: Diagnosis not present

## 2021-08-23 DIAGNOSIS — I129 Hypertensive chronic kidney disease with stage 1 through stage 4 chronic kidney disease, or unspecified chronic kidney disease: Secondary | ICD-10-CM | POA: Diagnosis not present

## 2021-08-23 DIAGNOSIS — N3946 Mixed incontinence: Secondary | ICD-10-CM | POA: Diagnosis not present

## 2021-08-23 DIAGNOSIS — M48062 Spinal stenosis, lumbar region with neurogenic claudication: Secondary | ICD-10-CM | POA: Diagnosis not present

## 2021-11-14 IMAGING — MG DIGITAL SCREENING BILAT W/ TOMO W/ CAD
6 of 12 series · 6 of 36 positions shown · non-contrast
Comparison: Previous exam(s).

CLINICAL DATA: Screening.

EXAM:
DIGITAL SCREENING BILATERAL MAMMOGRAM WITH TOMO AND CAD

[R MLO synth-2D (1 of 2)]
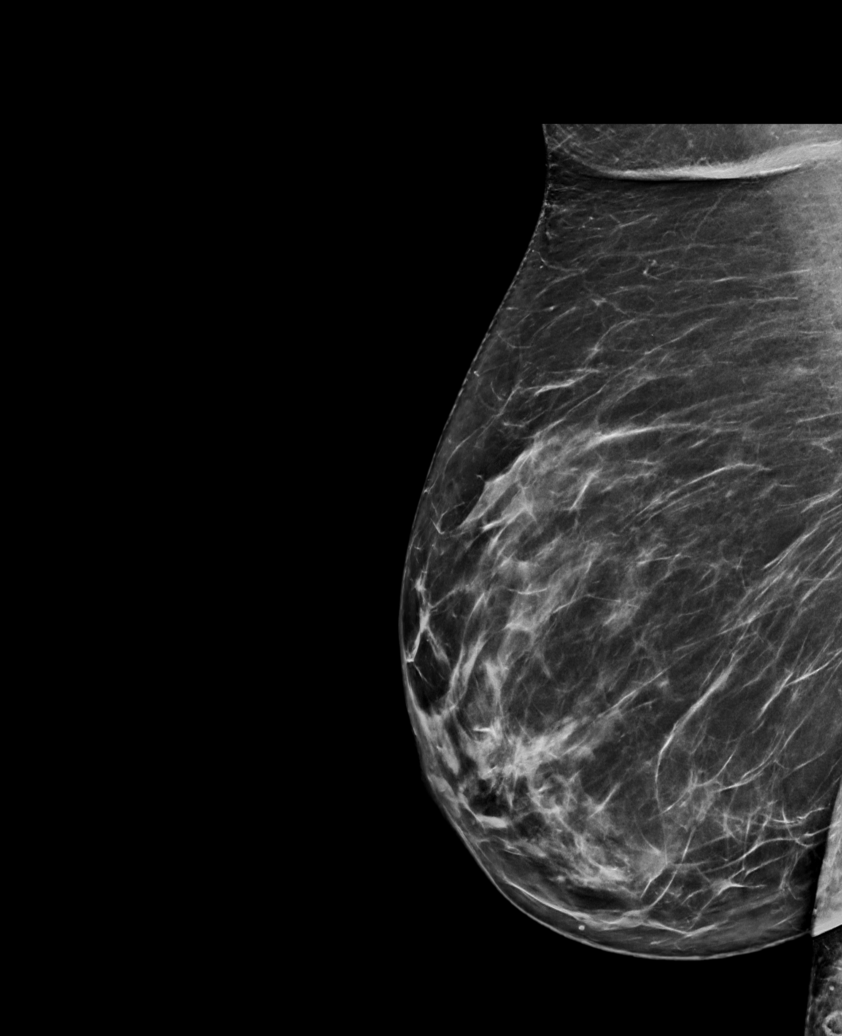

[L CC synth-2D]
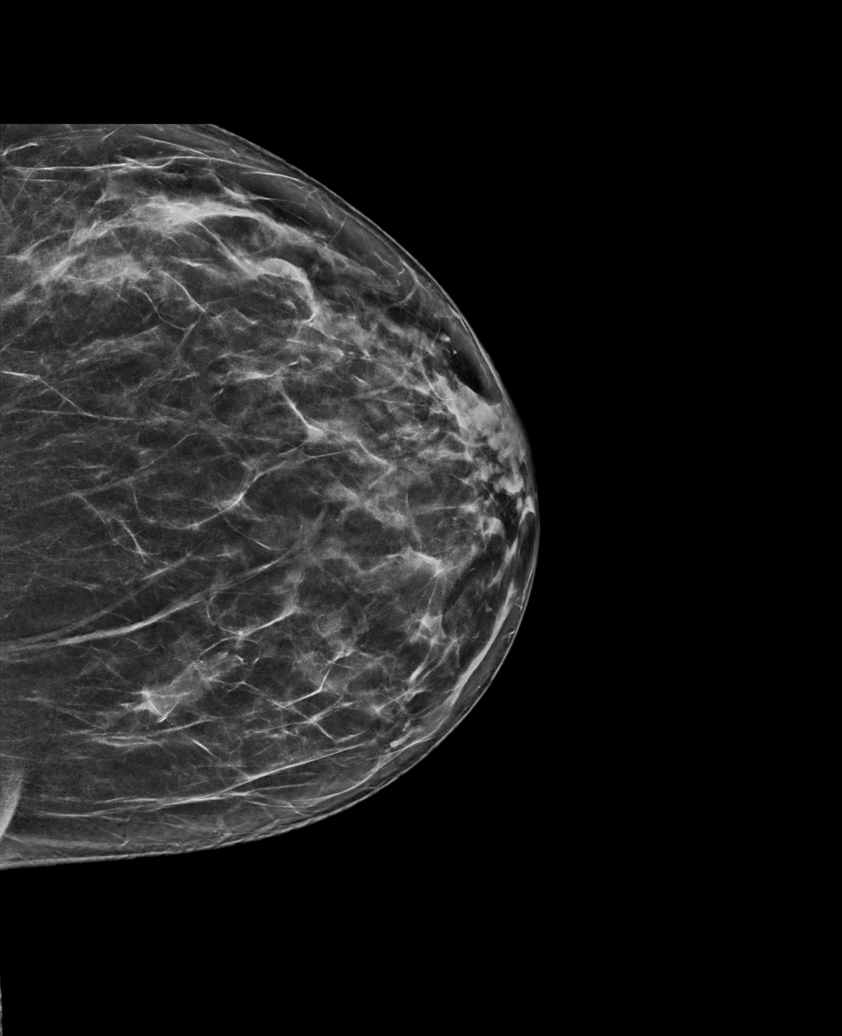

[R CC synth-2D]
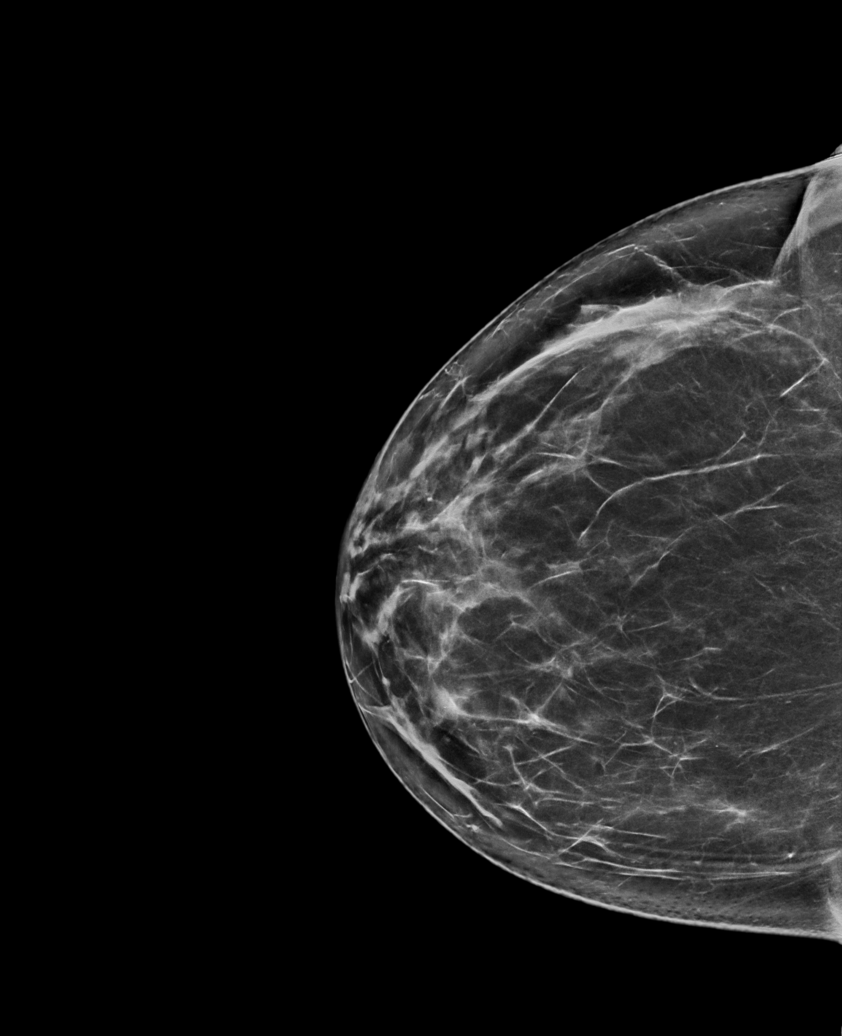

[R MLO synth-2D (2 of 2)]
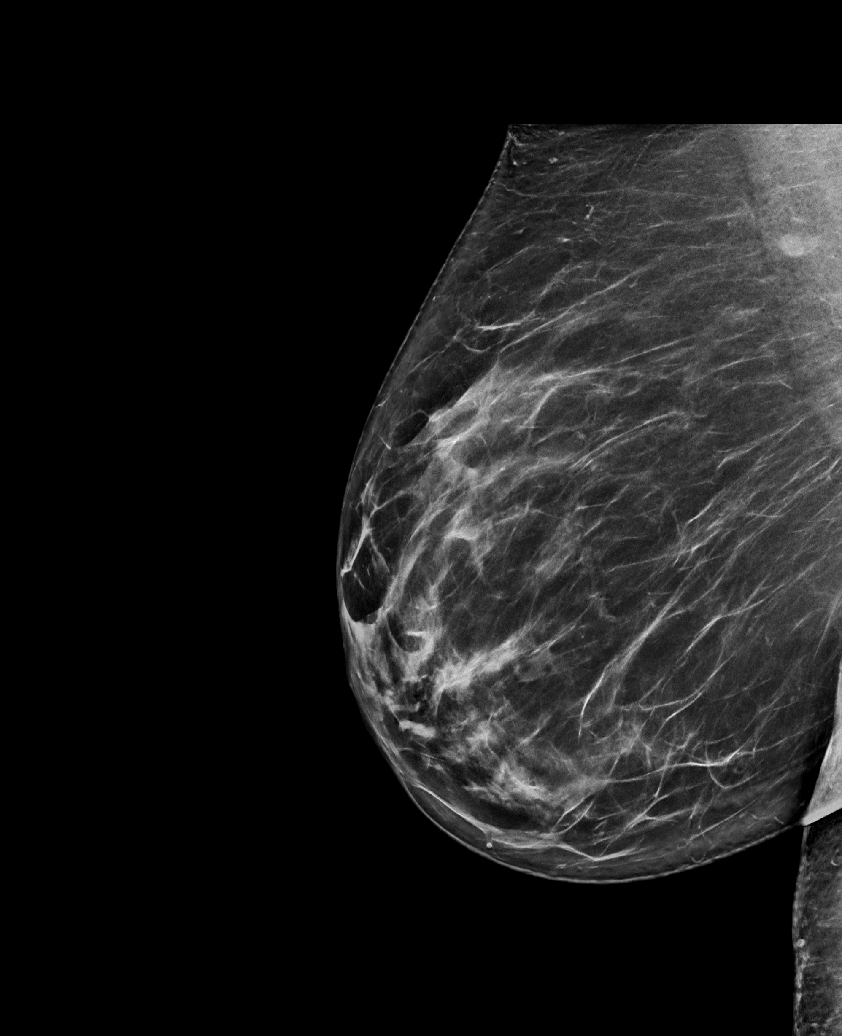

[L MLO synth-2D (1 of 2)]
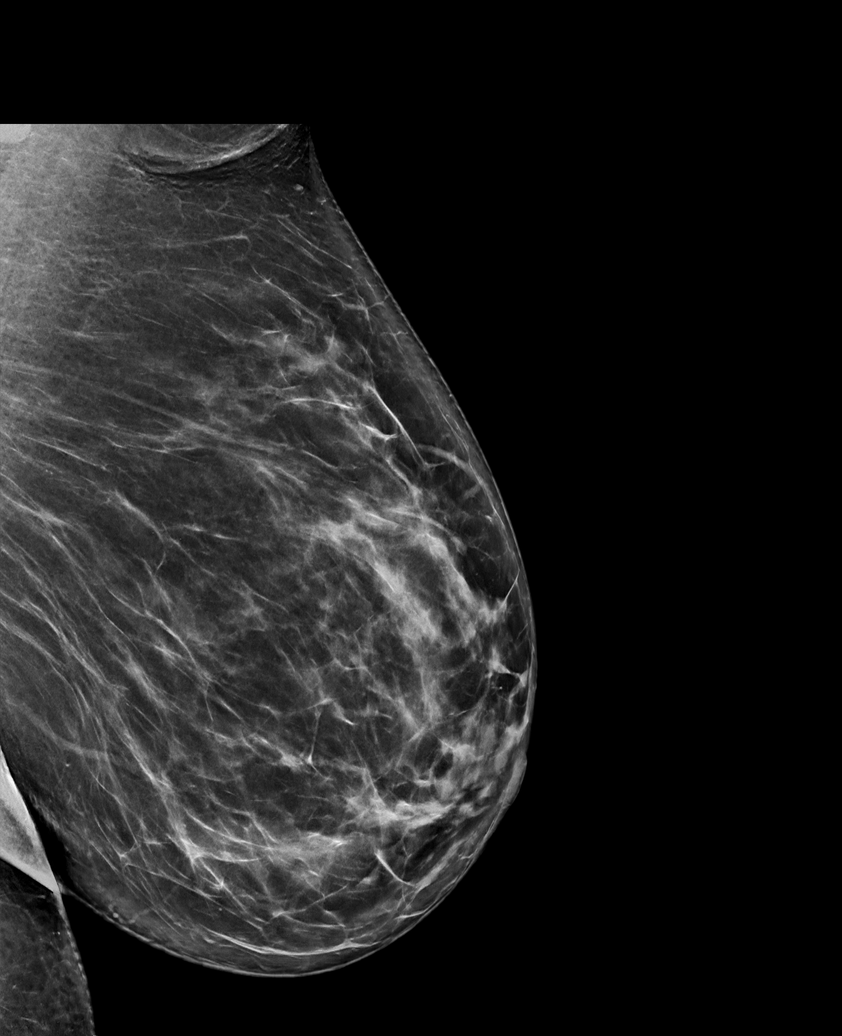

[L MLO synth-2D (2 of 2)]
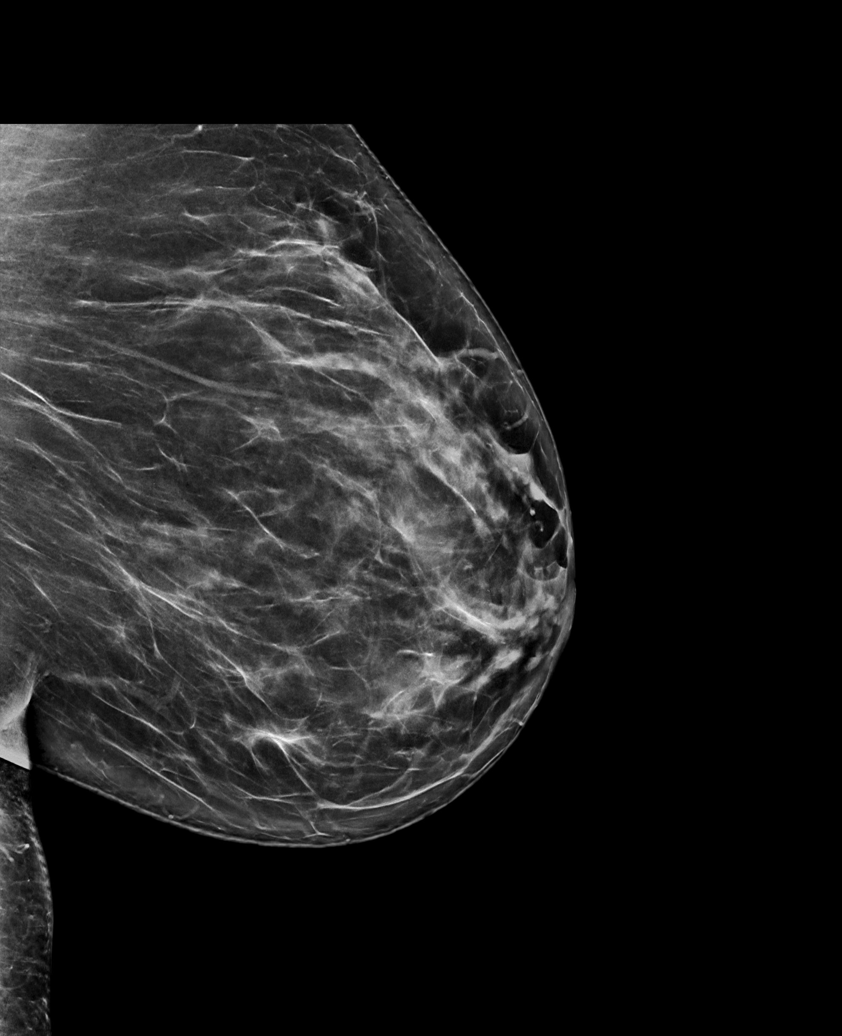

[6 of 36 positions shown; findings below may reference images not displayed]

ACR Breast Density Category c: The breast tissue is heterogeneously
dense, which may obscure small masses.
FINDINGS: There are no findings suspicious for malignancy. Images were
processed with CAD.
IMPRESSION: No mammographic evidence of malignancy. A result letter of this
screening mammogram will be mailed directly to the patient.

RECOMMENDATION:
Screening mammogram in one year. (Code:FT-U-LHB)

BI-RADS CATEGORY  1: Negative.

## 2022-01-15 DIAGNOSIS — H401132 Primary open-angle glaucoma, bilateral, moderate stage: Secondary | ICD-10-CM | POA: Diagnosis not present

## 2022-01-15 DIAGNOSIS — H52203 Unspecified astigmatism, bilateral: Secondary | ICD-10-CM | POA: Diagnosis not present

## 2022-01-15 DIAGNOSIS — Z961 Presence of intraocular lens: Secondary | ICD-10-CM | POA: Diagnosis not present

## 2022-02-27 DIAGNOSIS — M549 Dorsalgia, unspecified: Secondary | ICD-10-CM | POA: Diagnosis not present

## 2022-02-27 DIAGNOSIS — N183 Chronic kidney disease, stage 3 unspecified: Secondary | ICD-10-CM | POA: Diagnosis not present

## 2022-02-27 DIAGNOSIS — R7309 Other abnormal glucose: Secondary | ICD-10-CM | POA: Diagnosis not present

## 2022-02-27 DIAGNOSIS — R7303 Prediabetes: Secondary | ICD-10-CM | POA: Diagnosis not present

## 2022-02-27 DIAGNOSIS — M25542 Pain in joints of left hand: Secondary | ICD-10-CM | POA: Diagnosis not present

## 2022-02-27 DIAGNOSIS — Z79899 Other long term (current) drug therapy: Secondary | ICD-10-CM | POA: Diagnosis not present

## 2022-02-27 DIAGNOSIS — R202 Paresthesia of skin: Secondary | ICD-10-CM | POA: Diagnosis not present

## 2022-03-26 ENCOUNTER — Other Ambulatory Visit: Payer: Self-pay | Admitting: Family Medicine

## 2022-03-26 DIAGNOSIS — Z1231 Encounter for screening mammogram for malignant neoplasm of breast: Secondary | ICD-10-CM

## 2022-05-03 ENCOUNTER — Ambulatory Visit: Payer: Medicare Other

## 2022-05-16 DIAGNOSIS — N281 Cyst of kidney, acquired: Secondary | ICD-10-CM | POA: Diagnosis not present

## 2022-05-16 DIAGNOSIS — R351 Nocturia: Secondary | ICD-10-CM | POA: Diagnosis not present

## 2022-05-17 ENCOUNTER — Other Ambulatory Visit: Payer: Self-pay | Admitting: Urology

## 2022-05-17 DIAGNOSIS — N281 Cyst of kidney, acquired: Secondary | ICD-10-CM

## 2022-05-21 DIAGNOSIS — Z23 Encounter for immunization: Secondary | ICD-10-CM | POA: Diagnosis not present

## 2022-05-31 ENCOUNTER — Ambulatory Visit
Admission: RE | Admit: 2022-05-31 | Discharge: 2022-05-31 | Disposition: A | Discharge status: Home / Self Care | Payer: Medicare Other | Source: Ambulatory Visit | Attending: Family Medicine | Admitting: Family Medicine

## 2022-05-31 DIAGNOSIS — Z1231 Encounter for screening mammogram for malignant neoplasm of breast: Secondary | ICD-10-CM | POA: Diagnosis not present

## 2022-06-10 ENCOUNTER — Ambulatory Visit
Admission: RE | Admit: 2022-06-10 | Discharge: 2022-06-10 | Disposition: A | Discharge status: Home / Self Care | Payer: Medicare Other | Source: Ambulatory Visit | Attending: Urology | Admitting: Urology

## 2022-06-10 DIAGNOSIS — N281 Cyst of kidney, acquired: Secondary | ICD-10-CM

## 2022-06-10 MED ORDER — GADOPICLENOL 0.5 MMOL/ML IV SOLN
10.0000 mL | Freq: Once | INTRAVENOUS | Status: AC | PRN
  Administered 2022-06-10: 10 mL via INTRAVENOUS

## 2022-07-01 DIAGNOSIS — Z23 Encounter for immunization: Secondary | ICD-10-CM | POA: Diagnosis not present

## 2022-07-04 DIAGNOSIS — N812 Incomplete uterovaginal prolapse: Secondary | ICD-10-CM | POA: Diagnosis not present

## 2022-07-04 DIAGNOSIS — R351 Nocturia: Secondary | ICD-10-CM | POA: Diagnosis not present

## 2022-07-04 DIAGNOSIS — Z6834 Body mass index (BMI) 34.0-34.9, adult: Secondary | ICD-10-CM | POA: Diagnosis not present

## 2022-07-15 DIAGNOSIS — L03012 Cellulitis of left finger: Secondary | ICD-10-CM | POA: Diagnosis not present

## 2022-07-15 DIAGNOSIS — D1801 Hemangioma of skin and subcutaneous tissue: Secondary | ICD-10-CM | POA: Diagnosis not present

## 2022-07-15 DIAGNOSIS — L821 Other seborrheic keratosis: Secondary | ICD-10-CM | POA: Diagnosis not present

## 2022-07-15 DIAGNOSIS — D485 Neoplasm of uncertain behavior of skin: Secondary | ICD-10-CM | POA: Diagnosis not present

## 2022-08-28 DIAGNOSIS — L738 Other specified follicular disorders: Secondary | ICD-10-CM | POA: Diagnosis not present

## 2022-08-28 DIAGNOSIS — L089 Local infection of the skin and subcutaneous tissue, unspecified: Secondary | ICD-10-CM | POA: Diagnosis not present

## 2022-09-12 DIAGNOSIS — I700 Atherosclerosis of aorta: Secondary | ICD-10-CM | POA: Diagnosis not present

## 2022-09-12 DIAGNOSIS — E559 Vitamin D deficiency, unspecified: Secondary | ICD-10-CM | POA: Diagnosis not present

## 2022-09-12 DIAGNOSIS — Z0000 Encounter for general adult medical examination without abnormal findings: Secondary | ICD-10-CM | POA: Diagnosis not present

## 2022-09-12 DIAGNOSIS — D692 Other nonthrombocytopenic purpura: Secondary | ICD-10-CM | POA: Diagnosis not present

## 2022-09-12 DIAGNOSIS — K648 Other hemorrhoids: Secondary | ICD-10-CM | POA: Diagnosis not present

## 2022-09-12 DIAGNOSIS — M5412 Radiculopathy, cervical region: Secondary | ICD-10-CM | POA: Diagnosis not present

## 2022-09-12 DIAGNOSIS — R7303 Prediabetes: Secondary | ICD-10-CM | POA: Diagnosis not present

## 2022-09-12 DIAGNOSIS — I129 Hypertensive chronic kidney disease with stage 1 through stage 4 chronic kidney disease, or unspecified chronic kidney disease: Secondary | ICD-10-CM | POA: Diagnosis not present

## 2022-09-12 DIAGNOSIS — R252 Cramp and spasm: Secondary | ICD-10-CM | POA: Diagnosis not present

## 2022-09-12 DIAGNOSIS — J309 Allergic rhinitis, unspecified: Secondary | ICD-10-CM | POA: Diagnosis not present

## 2022-09-12 DIAGNOSIS — I10 Essential (primary) hypertension: Secondary | ICD-10-CM | POA: Diagnosis not present

## 2022-09-12 DIAGNOSIS — Z79899 Other long term (current) drug therapy: Secondary | ICD-10-CM | POA: Diagnosis not present

## 2022-11-12 DIAGNOSIS — H401123 Primary open-angle glaucoma, left eye, severe stage: Secondary | ICD-10-CM | POA: Diagnosis not present

## 2022-11-12 DIAGNOSIS — H401112 Primary open-angle glaucoma, right eye, moderate stage: Secondary | ICD-10-CM | POA: Diagnosis not present

## 2022-11-12 DIAGNOSIS — Z961 Presence of intraocular lens: Secondary | ICD-10-CM | POA: Diagnosis not present

## 2022-11-28 DIAGNOSIS — D2362 Other benign neoplasm of skin of left upper limb, including shoulder: Secondary | ICD-10-CM | POA: Diagnosis not present

## 2022-11-28 DIAGNOSIS — L821 Other seborrheic keratosis: Secondary | ICD-10-CM | POA: Diagnosis not present

## 2022-11-28 DIAGNOSIS — D2371 Other benign neoplasm of skin of right lower limb, including hip: Secondary | ICD-10-CM | POA: Diagnosis not present

## 2022-11-28 DIAGNOSIS — D2361 Other benign neoplasm of skin of right upper limb, including shoulder: Secondary | ICD-10-CM | POA: Diagnosis not present

## 2022-11-28 DIAGNOSIS — L814 Other melanin hyperpigmentation: Secondary | ICD-10-CM | POA: Diagnosis not present

## 2022-12-31 DIAGNOSIS — M25551 Pain in right hip: Secondary | ICD-10-CM | POA: Diagnosis not present

## 2022-12-31 DIAGNOSIS — M545 Low back pain, unspecified: Secondary | ICD-10-CM | POA: Diagnosis not present

## 2022-12-31 DIAGNOSIS — M25561 Pain in right knee: Secondary | ICD-10-CM | POA: Diagnosis not present

## 2022-12-31 DIAGNOSIS — M25562 Pain in left knee: Secondary | ICD-10-CM | POA: Diagnosis not present

## 2023-01-17 DIAGNOSIS — M6281 Muscle weakness (generalized): Secondary | ICD-10-CM | POA: Diagnosis not present

## 2023-01-17 DIAGNOSIS — R269 Unspecified abnormalities of gait and mobility: Secondary | ICD-10-CM | POA: Diagnosis not present

## 2023-01-17 DIAGNOSIS — M7601 Gluteal tendinitis, right hip: Secondary | ICD-10-CM | POA: Diagnosis not present

## 2023-01-24 DIAGNOSIS — M25551 Pain in right hip: Secondary | ICD-10-CM | POA: Diagnosis not present

## 2023-02-03 DIAGNOSIS — M6281 Muscle weakness (generalized): Secondary | ICD-10-CM | POA: Diagnosis not present

## 2023-02-03 DIAGNOSIS — R269 Unspecified abnormalities of gait and mobility: Secondary | ICD-10-CM | POA: Diagnosis not present

## 2023-02-03 DIAGNOSIS — M7601 Gluteal tendinitis, right hip: Secondary | ICD-10-CM | POA: Diagnosis not present

## 2023-02-12 DIAGNOSIS — R269 Unspecified abnormalities of gait and mobility: Secondary | ICD-10-CM | POA: Diagnosis not present

## 2023-02-12 DIAGNOSIS — M7601 Gluteal tendinitis, right hip: Secondary | ICD-10-CM | POA: Diagnosis not present

## 2023-02-12 DIAGNOSIS — M6281 Muscle weakness (generalized): Secondary | ICD-10-CM | POA: Diagnosis not present

## 2023-02-19 DIAGNOSIS — M6281 Muscle weakness (generalized): Secondary | ICD-10-CM | POA: Diagnosis not present

## 2023-02-19 DIAGNOSIS — M7601 Gluteal tendinitis, right hip: Secondary | ICD-10-CM | POA: Diagnosis not present

## 2023-02-19 DIAGNOSIS — R269 Unspecified abnormalities of gait and mobility: Secondary | ICD-10-CM | POA: Diagnosis not present

## 2023-02-24 DIAGNOSIS — M545 Low back pain, unspecified: Secondary | ICD-10-CM | POA: Diagnosis not present

## 2023-02-24 DIAGNOSIS — M25551 Pain in right hip: Secondary | ICD-10-CM | POA: Diagnosis not present

## 2023-02-26 DIAGNOSIS — M6281 Muscle weakness (generalized): Secondary | ICD-10-CM | POA: Diagnosis not present

## 2023-02-26 DIAGNOSIS — R269 Unspecified abnormalities of gait and mobility: Secondary | ICD-10-CM | POA: Diagnosis not present

## 2023-02-26 DIAGNOSIS — M7601 Gluteal tendinitis, right hip: Secondary | ICD-10-CM | POA: Diagnosis not present

## 2023-03-17 DIAGNOSIS — J45909 Unspecified asthma, uncomplicated: Secondary | ICD-10-CM | POA: Diagnosis not present

## 2023-03-17 DIAGNOSIS — R7303 Prediabetes: Secondary | ICD-10-CM | POA: Diagnosis not present

## 2023-03-28 DIAGNOSIS — H52203 Unspecified astigmatism, bilateral: Secondary | ICD-10-CM | POA: Diagnosis not present

## 2023-03-28 DIAGNOSIS — H401132 Primary open-angle glaucoma, bilateral, moderate stage: Secondary | ICD-10-CM | POA: Diagnosis not present

## 2023-03-28 DIAGNOSIS — Z961 Presence of intraocular lens: Secondary | ICD-10-CM | POA: Diagnosis not present

## 2023-05-07 ENCOUNTER — Other Ambulatory Visit: Payer: Self-pay | Admitting: Family Medicine

## 2023-05-07 DIAGNOSIS — Z1231 Encounter for screening mammogram for malignant neoplasm of breast: Secondary | ICD-10-CM

## 2023-05-20 DIAGNOSIS — Z23 Encounter for immunization: Secondary | ICD-10-CM | POA: Diagnosis not present

## 2023-06-03 DIAGNOSIS — Z23 Encounter for immunization: Secondary | ICD-10-CM | POA: Diagnosis not present

## 2023-06-13 ENCOUNTER — Ambulatory Visit
Admission: RE | Admit: 2023-06-13 | Discharge: 2023-06-13 | Disposition: A | Discharge status: Home / Self Care | Payer: Medicare Other | Source: Ambulatory Visit | Attending: Family Medicine | Admitting: Family Medicine

## 2023-06-13 DIAGNOSIS — Z1231 Encounter for screening mammogram for malignant neoplasm of breast: Secondary | ICD-10-CM | POA: Diagnosis not present

## 2023-10-07 DIAGNOSIS — H401132 Primary open-angle glaucoma, bilateral, moderate stage: Secondary | ICD-10-CM | POA: Diagnosis not present

## 2023-10-22 DIAGNOSIS — H401122 Primary open-angle glaucoma, left eye, moderate stage: Secondary | ICD-10-CM | POA: Diagnosis not present

## 2023-10-29 DIAGNOSIS — H401112 Primary open-angle glaucoma, right eye, moderate stage: Secondary | ICD-10-CM | POA: Diagnosis not present

## 2023-11-25 DIAGNOSIS — Z0000 Encounter for general adult medical examination without abnormal findings: Secondary | ICD-10-CM | POA: Diagnosis not present

## 2023-11-25 DIAGNOSIS — E559 Vitamin D deficiency, unspecified: Secondary | ICD-10-CM | POA: Diagnosis not present

## 2023-11-25 DIAGNOSIS — R7303 Prediabetes: Secondary | ICD-10-CM | POA: Diagnosis not present

## 2023-11-25 DIAGNOSIS — I10 Essential (primary) hypertension: Secondary | ICD-10-CM | POA: Diagnosis not present

## 2023-11-25 DIAGNOSIS — Z8639 Personal history of other endocrine, nutritional and metabolic disease: Secondary | ICD-10-CM | POA: Diagnosis not present

## 2023-11-25 DIAGNOSIS — Z79899 Other long term (current) drug therapy: Secondary | ICD-10-CM | POA: Diagnosis not present

## 2023-11-25 DIAGNOSIS — R8290 Unspecified abnormal findings in urine: Secondary | ICD-10-CM | POA: Diagnosis not present

## 2024-04-02 ENCOUNTER — Encounter: Payer: Self-pay | Admitting: Podiatry

## 2024-04-02 ENCOUNTER — Ambulatory Visit: Admitting: Podiatry

## 2024-04-02 VITALS — Ht 65.0 in | Wt 207.0 lb

## 2024-04-02 DIAGNOSIS — M79674 Pain in right toe(s): Secondary | ICD-10-CM | POA: Diagnosis not present

## 2024-04-02 DIAGNOSIS — M2042 Other hammer toe(s) (acquired), left foot: Secondary | ICD-10-CM

## 2024-04-02 DIAGNOSIS — M722 Plantar fascial fibromatosis: Secondary | ICD-10-CM | POA: Diagnosis not present

## 2024-04-02 DIAGNOSIS — M21619 Bunion of unspecified foot: Secondary | ICD-10-CM

## 2024-04-02 DIAGNOSIS — M79675 Pain in left toe(s): Secondary | ICD-10-CM | POA: Diagnosis not present

## 2024-04-02 DIAGNOSIS — B351 Tinea unguium: Secondary | ICD-10-CM | POA: Diagnosis not present

## 2024-04-02 DIAGNOSIS — M2041 Other hammer toe(s) (acquired), right foot: Secondary | ICD-10-CM | POA: Diagnosis not present

## 2024-04-02 MED ORDER — TRIAMCINOLONE ACETONIDE 10 MG/ML IJ SUSP
10.0000 mg | Freq: Once | INTRAMUSCULAR | Status: AC
  Administered 2024-04-02: 10 mg via INTRA_ARTICULAR

## 2024-04-02 NOTE — Progress Notes (Signed)
 Subjective:   Patient ID: Caitlin Woodward, female   DOB: 83 y.o.   MRN: [id number]   HPI Patient has nail disease that is very bad for her both feet and sore and also has inflammation of the right arch and digital deformities digits 2.  States that she cannot take care of the nails herself and the pain in the right arch is quite intense and patient does not currently smoke and likes to be active   Review of Systems  All other systems reviewed and are negative.       Objective:  Physical Exam Vitals and nursing note reviewed.  Constitutional:      Appearance: She is well-developed.  Pulmonary:     Effort: Pulmonary effort is normal.  Musculoskeletal:        General: Normal range of motion.  Skin:    General: Skin is warm.  Neurological:     Mental Status: She is alert.     Neurovascular status intact muscle strength found to be adequate range of motion adequate with patient noted to have inflammation pain of the mid arch area right fluid buildup severely thickened incurvated nailbeds 1-5 both feet with dystrophic changes and also is noted to have moderate structural bunion deformity right elevated second toes bilateral     Assessment:  Numerous problems chronic mycotic nail infection with pain acute plantar fasciitis mid arch right digital deformity structural bunion deformity     Plan:  H&P all conditions reviewed today sterile prep and injected the mid arch right 3 mg Kenalog  5 mg Xylocaine applied sterile dressing and I then debrided nailbeds 1-5 both feet no iatrogenic bleeding may have to address hammertoes at 1 point for bunion but at this point we are holding off and trying to just work on this conservatively

## 2024-04-13 DIAGNOSIS — H401133 Primary open-angle glaucoma, bilateral, severe stage: Secondary | ICD-10-CM | POA: Diagnosis not present

## 2024-04-13 DIAGNOSIS — Z961 Presence of intraocular lens: Secondary | ICD-10-CM | POA: Diagnosis not present

## 2024-05-07 ENCOUNTER — Other Ambulatory Visit: Payer: Self-pay | Admitting: Family Medicine

## 2024-05-07 DIAGNOSIS — Z1231 Encounter for screening mammogram for malignant neoplasm of breast: Secondary | ICD-10-CM

## 2024-05-11 DIAGNOSIS — Z23 Encounter for immunization: Secondary | ICD-10-CM | POA: Diagnosis not present

## 2024-05-21 DIAGNOSIS — R351 Nocturia: Secondary | ICD-10-CM | POA: Diagnosis not present

## 2024-05-21 DIAGNOSIS — N281 Cyst of kidney, acquired: Secondary | ICD-10-CM | POA: Diagnosis not present

## 2024-05-24 DIAGNOSIS — R143 Flatulence: Secondary | ICD-10-CM | POA: Diagnosis not present

## 2024-05-24 DIAGNOSIS — I10 Essential (primary) hypertension: Secondary | ICD-10-CM | POA: Diagnosis not present

## 2024-05-24 DIAGNOSIS — Z23 Encounter for immunization: Secondary | ICD-10-CM | POA: Diagnosis not present

## 2024-05-24 DIAGNOSIS — H6120 Impacted cerumen, unspecified ear: Secondary | ICD-10-CM | POA: Diagnosis not present

## 2024-05-24 DIAGNOSIS — R7303 Prediabetes: Secondary | ICD-10-CM | POA: Diagnosis not present

## 2024-05-24 DIAGNOSIS — J069 Acute upper respiratory infection, unspecified: Secondary | ICD-10-CM | POA: Diagnosis not present

## 2024-05-28 DIAGNOSIS — Z23 Encounter for immunization: Secondary | ICD-10-CM | POA: Diagnosis not present

## 2024-06-03 ENCOUNTER — Other Ambulatory Visit: Payer: Self-pay | Admitting: Urology

## 2024-06-03 DIAGNOSIS — N281 Cyst of kidney, acquired: Secondary | ICD-10-CM

## 2024-06-10 DIAGNOSIS — G8929 Other chronic pain: Secondary | ICD-10-CM | POA: Diagnosis not present

## 2024-06-10 DIAGNOSIS — M25512 Pain in left shoulder: Secondary | ICD-10-CM | POA: Diagnosis not present

## 2024-06-15 ENCOUNTER — Ambulatory Visit
Admission: RE | Admit: 2024-06-15 | Discharge: 2024-06-15 | Disposition: A | Discharge status: Home / Self Care | Source: Ambulatory Visit | Attending: Family Medicine | Admitting: Family Medicine

## 2024-06-15 DIAGNOSIS — Z1231 Encounter for screening mammogram for malignant neoplasm of breast: Secondary | ICD-10-CM

## 2024-06-18 DIAGNOSIS — H6123 Impacted cerumen, bilateral: Secondary | ICD-10-CM | POA: Diagnosis not present

## 2024-07-05 ENCOUNTER — Encounter: Payer: Self-pay | Admitting: Podiatry

## 2024-07-05 ENCOUNTER — Ambulatory Visit (INDEPENDENT_AMBULATORY_CARE_PROVIDER_SITE_OTHER): Admitting: Podiatry

## 2024-07-05 DIAGNOSIS — M79674 Pain in right toe(s): Secondary | ICD-10-CM | POA: Diagnosis not present

## 2024-07-05 DIAGNOSIS — B351 Tinea unguium: Secondary | ICD-10-CM

## 2024-07-05 DIAGNOSIS — M79675 Pain in left toe(s): Secondary | ICD-10-CM

## 2024-07-05 DIAGNOSIS — M7752 Other enthesopathy of left foot: Secondary | ICD-10-CM | POA: Diagnosis not present

## 2024-07-05 MED ORDER — TRIAMCINOLONE ACETONIDE 10 MG/ML IJ SUSP
10.0000 mg | Freq: Once | INTRAMUSCULAR | Status: AC
  Administered 2024-07-05: 10 mg via INTRA_ARTICULAR

## 2024-07-05 NOTE — Progress Notes (Signed)
 Subjective:   Patient ID: Caitlin Woodward, female   DOB: 83 y.o.   MRN: [id number]   HPI Patient presents with a lot of pain big toe left foot against the second toe with fluid buildup and has thick nailbeds 1-5 both feet that are painful and make wearing shoe gear difficult.   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

## 2024-07-14 DIAGNOSIS — H6123 Impacted cerumen, bilateral: Secondary | ICD-10-CM | POA: Diagnosis not present

## 2024-07-15 ENCOUNTER — Ambulatory Visit
Admission: RE | Admit: 2024-07-15 | Discharge: 2024-07-15 | Disposition: A | Discharge status: Home / Self Care | Source: Ambulatory Visit | Attending: Urology | Admitting: Urology

## 2024-07-15 DIAGNOSIS — N281 Cyst of kidney, acquired: Secondary | ICD-10-CM | POA: Diagnosis not present

## 2024-07-15 MED ORDER — GADOPICLENOL 0.5 MMOL/ML IV SOLN
9.0000 mL | Freq: Once | INTRAVENOUS | Status: AC | PRN
  Administered 2024-07-15: 12:00:00 9 mL via INTRAVENOUS

## 2024-10-04 ENCOUNTER — Ambulatory Visit: Admitting: Podiatry
